# Patient Record
Sex: Female | Born: 2000
Health system: Southern US, Community
[De-identification: ages and names within clinical notes are randomized; demographics above are authoritative.]

## PROBLEM LIST (undated history)

## (undated) HISTORY — PX: NO PAST SURGERIES: SHX2092

---
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
## 2020-01-24 DIAGNOSIS — Z3A19 19 weeks gestation of pregnancy: Secondary | ICD-10-CM | POA: Diagnosis not present

## 2020-01-24 DIAGNOSIS — Z3689 Encounter for other specified antenatal screening: Secondary | ICD-10-CM | POA: Diagnosis not present

## 2020-01-24 DIAGNOSIS — Z363 Encounter for antenatal screening for malformations: Secondary | ICD-10-CM | POA: Diagnosis not present

## 2020-01-27 DIAGNOSIS — Z3A20 20 weeks gestation of pregnancy: Secondary | ICD-10-CM | POA: Diagnosis not present

## 2020-01-27 DIAGNOSIS — Z3402 Encounter for supervision of normal first pregnancy, second trimester: Secondary | ICD-10-CM | POA: Diagnosis not present

## 2020-02-13 DIAGNOSIS — R300 Dysuria: Secondary | ICD-10-CM | POA: Diagnosis not present

## 2020-02-23 ENCOUNTER — Other Ambulatory Visit: Payer: Self-pay

## 2020-02-23 ENCOUNTER — Inpatient Hospital Stay (HOSPITAL_COMMUNITY)
Admission: AD | Admit: 2020-02-23 | Discharge: 2020-02-23 | Disposition: A | Discharge status: Home / Self Care | Payer: BC Managed Care – PPO | Attending: Obstetrics and Gynecology | Admitting: Obstetrics and Gynecology

## 2020-02-23 ENCOUNTER — Encounter (HOSPITAL_COMMUNITY): Payer: Self-pay | Admitting: Obstetrics and Gynecology

## 2020-02-23 DIAGNOSIS — O26892 Other specified pregnancy related conditions, second trimester: Secondary | ICD-10-CM | POA: Diagnosis not present

## 2020-02-23 DIAGNOSIS — Z3A23 23 weeks gestation of pregnancy: Secondary | ICD-10-CM | POA: Diagnosis not present

## 2020-02-23 DIAGNOSIS — O98812 Other maternal infectious and parasitic diseases complicating pregnancy, second trimester: Secondary | ICD-10-CM | POA: Insufficient documentation

## 2020-02-23 DIAGNOSIS — B373 Candidiasis of vulva and vagina: Secondary | ICD-10-CM

## 2020-02-23 DIAGNOSIS — R1030 Lower abdominal pain, unspecified: Secondary | ICD-10-CM | POA: Diagnosis not present

## 2020-02-23 DIAGNOSIS — O368120 Decreased fetal movements, second trimester, not applicable or unspecified: Secondary | ICD-10-CM | POA: Diagnosis not present

## 2020-02-23 DIAGNOSIS — Z3689 Encounter for other specified antenatal screening: Secondary | ICD-10-CM

## 2020-02-23 DIAGNOSIS — O26852 Spotting complicating pregnancy, second trimester: Secondary | ICD-10-CM | POA: Diagnosis not present

## 2020-02-23 DIAGNOSIS — B3731 Acute candidiasis of vulva and vagina: Secondary | ICD-10-CM

## 2020-02-23 LAB — URINALYSIS, ROUTINE W REFLEX MICROSCOPIC
Bilirubin Urine: NEGATIVE
Glucose, UA: NEGATIVE mg/dL
Ketones, ur: NEGATIVE mg/dL
Nitrite: NEGATIVE
Protein, ur: NEGATIVE mg/dL
Specific Gravity, Urine: 1.015 (ref 1.005–1.030)
pH: 7.5 (ref 5.0–8.0)

## 2020-02-23 LAB — HIV ANTIBODY (ROUTINE TESTING W REFLEX): HIV Screen 4th Generation wRfx: NONREACTIVE

## 2020-02-23 LAB — OB RESULTS CONSOLE GC/CHLAMYDIA: Gonorrhea: NEGATIVE

## 2020-02-23 LAB — URINALYSIS, MICROSCOPIC (REFLEX)

## 2020-02-23 LAB — WET PREP, GENITAL
Clue Cells Wet Prep HPF POC: NONE SEEN
Sperm: NONE SEEN
Trich, Wet Prep: NONE SEEN

## 2020-02-23 MED ORDER — FLUCONAZOLE 150 MG PO TABS: 150.0000 mg | ORAL_TABLET | ORAL | 1 refills | Status: DC

## 2020-02-23 NOTE — MAU Note (Signed)
Rebecca Zimmerman is a 19 y.o. at [redacted]w[redacted]d here in MAU reporting: since Saturday has been having DFM. Went to the bathroom today and saw some green discharge and bleeding. Having intermittent lower abdominal/pelvic pain but denies pain at this time. No LOF.  Was getting [facility] in [place] but last appointment was at the end of October. Is waiting on [facility] to call her back regarding an appointment.  Onset of complaint: ongoing  Pain score:  0/10  Vitals:   02/23/20 1151  BP: 131/80  Pulse: 93  Resp: 16  Temp: 98.5 F (36.9 C)  SpO2: 98%     FHT: 160  Lab orders placed from triage: UA, UPT

## 2020-02-23 NOTE — Discharge Instructions (Signed)
Prenatal Care Providers           Center for [facility] @ [facility]  [address] [phone]  Center for [facility] @ [facility]   [address]  [phone]  Center For [facility] @ [facility]       [address] [phone]            Center for [facility] @ [place]     [phone]          Center for [facility] @ [facility]   [phone]  Center for [facility] @ [facility]  [address] [phone]     Center for [facility] @ [address] [place])  [address]   [phone]     [facility]  Phone: [phone]  [facility]  Phone: [phone]  [facility] Phone: [phone]  [facility] Phone: [phone]  [facility] Phone: [phone]  [facility] Phone: [phone]

## 2020-02-23 NOTE — MAU Provider Note (Signed)
History     CSN: [id number]  Arrival date and time: 02/23/20 1126   First Provider Initiated Contact with Patient 02/23/20 1237      Chief Complaint  Patient presents with  . Vaginal Bleeding  . Vaginal Discharge  . Decreased Fetal Movement   Rebecca Zimmerman is a 19 y.o. year old G1P0 female at [redacted]w[redacted]d weeks gestation who presents to MAU reporting she noticed spotting and green discharge with wiping x 1 episode, lower abdominal cramping and DFM since Saturday. She denies LOF She reports OB care in [place]; last appt the end of October. She reports that she is trying to get established with a local OB and not having any luck finding a provider/office that takes her insurance without large payment and co-payments.   OB History    Gravida  1   Para      Term      Preterm      AB      Living        SAB      TAB      Ectopic      Multiple      Live Births              History reviewed. No pertinent past medical history.  Past Surgical History:  Procedure Laterality Date  . NO PAST SURGERIES      Family History  Problem Relation Age of Onset  . Epilepsy Father   . Healthy Sister   . Healthy Brother   . Healthy Brother   . Healthy Brother     Social History   Tobacco Use  . Smoking status: Never Smoker  . Smokeless tobacco: Never Used  Substance Use Topics  . Alcohol use: No    Alcohol/week: 0.0 standard drinks  . Drug use: No    Allergies: No Known Allergies  No medications prior to admission.    Review of Systems  Constitutional: Negative.   HENT: Negative.   Eyes: Negative.   Respiratory: Negative.   Cardiovascular: Negative.   Gastrointestinal: Negative.   Endocrine: Negative.   Genitourinary: Positive for vaginal discharge (green discharge and spotting noted on tissue this AM).       DFM since Saturday 02/19/2020  Musculoskeletal: Negative.   Skin: Negative.   Allergic/Immunologic: Negative.   Neurological: Negative.    Hematological: Negative.   Psychiatric/Behavioral: Negative.    Physical Exam   Blood pressure 115/61, pulse 84, temperature 98.4 F (36.9 C), temperature source Oral, resp. rate 16, height 5\' 7"  (1.702 m), weight 90.5 kg, SpO2 100 %.  Physical Exam Vitals and nursing note reviewed. Exam conducted with a chaperone present.  Constitutional:      Appearance: Normal appearance. She is normal weight.  HENT:     Head: Normocephalic and atraumatic.  Eyes:     Pupils: Pupils are equal, round, and reactive to light.  Cardiovascular:     Rate and Rhythm: Normal rate.     Pulses: Normal pulses.  Pulmonary:     Effort: Pulmonary effort is normal.  Genitourinary:    General: Normal vulva.     Comments: Uterus: gravid, S=D, SE: cervix is smooth, pink, no lesions, copious amt of thick, clumpy, greenish yellow vaginal d/c -- WP, GC/CT done, closed/long/firm, no CMT or friability, no adnexal tenderness  Musculoskeletal:     Cervical back: Normal range of motion.  Neurological:     Mental Status: She is alert and oriented to person,  place, and time.  Psychiatric:        Mood and Affect: Mood normal.        Behavior: Behavior normal.        Thought Content: Thought content normal.        Judgment: Judgment normal.    REACTIVE NST - FHR: 140 bpm / moderate variability / accels present / decels absent / TOCO: none  MAU Course  Procedures  MDM CCUA Wet Prep GC/CT -- Results pending  HIV -- Results pending   Results for orders placed or performed during the hospital encounter of 02/23/20 (from the past 24 hour(s))  Urinalysis, Routine w reflex microscopic Urine, Clean Catch     Status: Abnormal   Collection Time: 02/23/20 12:42 PM  Result Value Ref Range   Color, Urine YELLOW YELLOW   APPearance CLOUDY (A) CLEAR   Specific Gravity, Urine 1.015 1.005 - 1.030   pH 7.5 5.0 - 8.0   Glucose, UA NEGATIVE NEGATIVE mg/dL   Hgb urine dipstick SMALL (A) NEGATIVE   Bilirubin Urine NEGATIVE  NEGATIVE   Ketones, ur NEGATIVE NEGATIVE mg/dL   Protein, ur NEGATIVE NEGATIVE mg/dL   Nitrite NEGATIVE NEGATIVE   Leukocytes,Ua LARGE (A) NEGATIVE  Urinalysis, Microscopic (reflex)     Status: Abnormal   Collection Time: 02/23/20 12:42 PM  Result Value Ref Range   RBC / HPF 0-5 0 - 5 RBC/hpf   WBC, UA 21-50 0 - 5 WBC/hpf   Bacteria, UA MANY (A) NONE SEEN   Squamous Epithelial / LPF 21-50 0 - 5  HIV Antibody (routine testing w rflx)     Status: None   Collection Time: 02/23/20 12:50 PM  Result Value Ref Range   HIV Screen 4th Generation wRfx Non Reactive Non Reactive  Wet prep, genital     Status: Abnormal   Collection Time: 02/23/20 12:52 PM  Result Value Ref Range   Yeast Wet Prep HPF POC PRESENT (A) NONE SEEN   Trich, Wet Prep NONE SEEN NONE SEEN   Clue Cells Wet Prep HPF POC NONE SEEN NONE SEEN   WBC, Wet Prep HPF POC MANY (A) NONE SEEN   Sperm NONE SEEN     Assessment and Plan  Candida vaginitis  - Rx for Diflucan 150 mg 1 tablet every 3 days x 3 doses   NST (non-stress test) reactive - Reassurance given that fetal well-being is normal at this time  - Discharge patient - Information provided on [facility] Care Providers list given  - Get scheduled with OB provider of choice - Patient verbalized an understanding of the plan of care and agrees.   Raelyn Mora, MSN, CNM 02/23/2020, 12:47 PM

## 2020-02-24 LAB — GC/CHLAMYDIA PROBE AMP (~~LOC~~) NOT AT ARMC
Chlamydia: NEGATIVE
Comment: NEGATIVE
Comment: NORMAL
Neisseria Gonorrhea: NEGATIVE

## 2020-03-06 DIAGNOSIS — O0993 Supervision of high risk pregnancy, unspecified, third trimester: Secondary | ICD-10-CM | POA: Insufficient documentation

## 2020-03-06 NOTE — Progress Notes (Signed)
Established patient visit   Patient: Rebecca Zimmerman   DOB: 2001-01-19   19 y.o. Female  MRN: [id number] Visit Date: 03/07/2020  Today's healthcare provider: Jairo Ben, FNP   Chief Complaint  Patient presents with  . Cyst   Subjective    HPI  Patient appears in office today to address concern of draining cyst located near her tail bone, according to telephone encounter from [facility] on 03/06/20 patient reported that cyst had been present since 03/01/20.Patient states today that "cyst/bump" began draining on 03/03/20 and states that drainage was a yellow bloody consistency, she states that sure is very tender to the touch she has been soaking in epson salt baths for relief. .   She is seeing OB for her pregnancy. She is [redacted] weeks pregnant.  She has had some second trimester bleeding thought to be from yeast infection that was treated.  She is feeling good fetal movement.  She denies any bleeding or contractions now.    Patient  denies any fever, body aches,chills, rash, chest pain, shortness of breath, nausea, vomiting, or diarrhea.  Denies dizziness, lightheadedness, pre syncopal or syncopal episodes.   There are no problems to display for this patient.  History reviewed. No pertinent past medical history. Past Surgical History:  Procedure Laterality Date  . NO PAST SURGERIES     Social History   Tobacco Use  . Smoking status: Never Smoker  . Smokeless tobacco: Never Used  Substance Use Topics  . Alcohol use: No    Alcohol/week: 0.0 standard drinks  . Drug use: No   Social History   Socioeconomic History  . Marital status: Single    Spouse name: Not on file  . Number of children: Not on file  . Years of education: Not on file  . Highest education level: Not on file  Occupational History  . Not on file  Tobacco Use  . Smoking status: Never Smoker  . Smokeless tobacco: Never Used  Substance and Sexual Activity  . Alcohol use: No     Alcohol/week: 0.0 standard drinks  . Drug use: No  . Sexual activity: Never  Other Topics Concern  . Not on file  Social History Narrative  . Not on file   Social Determinants of Health   Financial Resource Strain:   . Difficulty of Paying Living Expenses: Not on file  Food Insecurity:   . Worried About Programme researcher, broadcasting/film/video in the Last Year: Not on file  . Ran Out of Food in the Last Year: Not on file  Transportation Needs:   . Lack of Transportation (Medical): Not on file  . Lack of Transportation (Non-Medical): Not on file  Physical Activity:   . Days of Exercise per Week: Not on file  . Minutes of Exercise per Session: Not on file  Stress:   . Feeling of Stress : Not on file  Social Connections:   . Frequency of Communication with Friends and Family: Not on file  . Frequency of Social Gatherings with Friends and Family: Not on file  . Attends Religious Services: Not on file  . Active Member of Clubs or Organizations: Not on file  . Attends Banker Meetings: Not on file  . Marital Status: Not on file  Intimate Partner Violence:   . Fear of Current or Ex-Partner: Not on file  . Emotionally Abused: Not on file  . Physically Abused: Not on file  . Sexually Abused: Not  on file   Family Status  Relation Name Status  . Mother  Alive  . Father  Alive  . Sister  Alive  . Brother  Alive  . Brother  Alive  . Brother  Alive   Family History  Problem Relation Age of Onset  . Epilepsy Father   . Healthy Sister   . Healthy Brother   . Healthy Brother   . Healthy Brother    No Known Allergies     Medications: Outpatient Medications Prior to Visit  Medication Sig  . ondansetron (ZOFRAN) 4 MG tablet Take 4 mg by mouth every 8 (eight) hours as needed for nausea or vomiting.  . [DISCONTINUED] fluconazole (DIFLUCAN) 150 MG tablet Take 1 tablet (150 mg total) by mouth every 3 (three) days.   No facility-administered medications prior to visit.    Review of  Systems    Last CBC Lab Results  Component Value Date   WBC 9.1 05/18/2015   HGB 12.2 05/18/2015   HCT 35.3 05/18/2015   MCV 92 05/18/2015   MCH 31.6 05/18/2015   RDW 12.3 05/18/2015   PLT 264 05/18/2015   Last metabolic panel Lab Results  Component Value Date   GLUCOSE 106 (H) 05/18/2015   NA 138 05/18/2015   K 3.9 05/18/2015   CL 98 05/18/2015   CO2 26 05/18/2015   BUN 8 05/18/2015   CREATININE 0.73 05/18/2015   GFRNONAA CANCELED 05/18/2015   GFRAA CANCELED 05/18/2015   CALCIUM 10.1 05/18/2015   PROT 7.6 05/18/2015   ALBUMIN 5.1 05/18/2015   LABGLOB 2.5 05/18/2015   AGRATIO 2.0 05/18/2015   BILITOT 1.6 (H) 05/18/2015   ALKPHOS 84 05/18/2015   AST 12 05/18/2015   ALT 10 05/18/2015   Last lipids No results found for: CHOL, HDL, LDLCALC, LDLDIRECT, TRIG, CHOLHDL Last hemoglobin A1c No results found for: HGBA1C Last thyroid functions No results found for: TSH, T3TOTAL, T4TOTAL, THYROIDAB Last vitamin D No results found for: 25OHVITD2, 25OHVITD3, VD25OH Last vitamin B12 and Folate No results found for: VITAMINB12, FOLATE    Objective    BP 120/66   Pulse 89   Temp 98.2 F (36.8 C) (Oral)   Resp 16   Wt 207 lb 12.8 oz (94.3 kg)   SpO2 100%   BMI 32.55 kg/m  BP Readings from Last 3 Encounters:  03/07/20 120/66  02/23/20 115/61  02/22/19 114/74   Wt Readings from Last 3 Encounters:  03/07/20 207 lb 12.8 oz (94.3 kg) (98 %, Z= 2.06)*  02/23/20 199 lb 9.6 oz (90.5 kg) (97 %, Z= 1.94)*  02/22/19 160 lb 3.2 oz (72.7 kg) (89 %, Z= 1.25)*   * Growth percentiles are based on CDC (Girls, 2-20 Years) data.      Physical Exam Vitals reviewed.  Constitutional:      General: She is not in acute distress.    Appearance: Normal appearance. She is not ill-appearing, toxic-appearing or diaphoretic.     Comments: Patient appers well, not sickly. Speaking in complete sentences. Patient moves on and off of exam table and in room without difficulty. Gait is normal  in hall and in room. Patient is oriented to person place time and situation. Patient answers questions appropriately and engages eye contact and verbal dialect with provider.   HENT:     Head: Normocephalic and atraumatic.     Right Ear: External ear normal.     Left Ear: External ear normal.     Nose: Nose normal. No  congestion or rhinorrhea.     Mouth/Throat:     Pharynx: Oropharynx is clear. No oropharyngeal exudate or posterior oropharyngeal erythema.  Eyes:     General: No scleral icterus.    Extraocular Movements: Extraocular movements intact.     Conjunctiva/sclera: Conjunctivae normal.  Cardiovascular:     Rate and Rhythm: Normal rate and regular rhythm.     Pulses: Normal pulses.     Heart sounds: Normal heart sounds.  Pulmonary:     Effort: Pulmonary effort is normal. No respiratory distress.     Breath sounds: Normal breath sounds. No stridor. No wheezing, rhonchi or rales.  Chest:     Chest wall: No tenderness.  Abdominal:     Palpations: Abdomen is soft.     Comments: Pregnant   Musculoskeletal:        General: Normal range of motion.     Cervical back: Normal range of motion and neck supple. No rigidity.  Lymphadenopathy:     Lower Body: No right inguinal adenopathy. No left inguinal adenopathy.  Skin:    General: Skin is warm.     Findings: Abscess present. No rash.       Neurological:     Mental Status: She is oriented to person, place, and time.     Motor: No weakness.     Gait: Gait normal.  Psychiatric:        Mood and Affect: Mood normal.        Behavior: Behavior normal.        Thought Content: Thought content normal.        Judgment: Judgment normal.     No results found for any visits on 03/07/20.  Assessment & Plan     Pilonidal cyst with abscess - Plan: Ambulatory referral to General Surgery, CBC with Differential/Platelet, Comprehensive Metabolic Panel (CMET), Aerobic culture  Orders Placed This Encounter  Procedures  . Aerobic culture    . CBC with Differential/Platelet  . Comprehensive Metabolic Panel (CMET)  . Ambulatory referral to General Surgery    Culture obtained.  Meds ordered this encounter  Medications  . cephALEXin (KEFLEX) 500 MG capsule    Sig: Take 1 capsule (500 mg total) by mouth 3 (three) times daily.    Dispense:  21 capsule    Refill:  0  Start medication as above, discussed risk versus benefits.  Draining already no need for I & D at this time.  Wound care instructions given.  An After Visit Summary was printed and given to the patient. Seek care with OBGYN if pregnancy concerns.   Will refer to surgeon for evaluation.    Red Flags discussed. The patient was given clear instructions to go to ER or return to medical center if any red flags develop, symptoms do not improve, worsen or new problems develop. They verbalized understanding.  Return in about 2 weeks (around 03/21/2020), or if symptoms worsen or fail to improve, for at any time for any worsening symptoms, Go to Emergency room/ urgent care if worse.      Needs to have CPE done and establish care with provider has not been seen since seeing [place] PA in office.     Jairo Ben, FNP  [facility] [phone] (phone) [phone] (fax)  [facility]

## 2020-03-07 ENCOUNTER — Encounter: Payer: Self-pay | Admitting: Adult Health

## 2020-03-07 ENCOUNTER — Other Ambulatory Visit: Payer: Self-pay

## 2020-03-07 ENCOUNTER — Ambulatory Visit (INDEPENDENT_AMBULATORY_CARE_PROVIDER_SITE_OTHER): Payer: BC Managed Care – PPO | Admitting: Adult Health

## 2020-03-07 VITALS — BP 120/66 | HR 89 | Temp 98.2°F | Resp 16 | Wt 207.8 lb

## 2020-03-07 DIAGNOSIS — L0501 Pilonidal cyst with abscess: Secondary | ICD-10-CM | POA: Diagnosis not present

## 2020-03-07 MED ORDER — CEPHALEXIN 500 MG PO CAPS: 500.0000 mg | ORAL_CAPSULE | Freq: Three times a day (TID) | ORAL | 0 refills | Status: DC

## 2020-03-07 NOTE — Patient Instructions (Signed)
Cephalexin Tablets or Capsules What is this medicine? CEPHALEXIN (sef a LEX in) is a cephalosporin antibiotic. It treats some infections caused by bacteria. It will not work for colds, the flu, or other viruses. This medicine may be used for other purposes; ask your health care provider or pharmacist if you have questions. COMMON BRAND NAME(S): Biocef, Daxbia, Keflex, Keftab What should I tell my health care provider before I take this medicine? They need to know if you have any of these conditions:  kidney disease  stomach or intestine problems, especially colitis  an unusual or allergic reaction to cephalexin, other cephalosporins, penicillins, other antibiotics, medicines, foods, dyes or preservatives  pregnant or trying to get pregnant  breast-feeding How should I use this medicine? Take this drug by mouth. Take it as directed on the prescription label at the same time every day. You can take it with or without food. If it upsets your stomach, take it with food. Take all of this drug unless your health care provider tells you to stop it early. Keep taking it even if you think you are better. Talk to your health care provider about the use of this drug in children. While it may be prescribed for selected conditions, precautions do apply. Overdosage: If you think you have taken too much of this medicine contact a poison control center or emergency room at once. NOTE: This medicine is only for you. Do not share this medicine with others. What if I miss a dose? If you miss a dose, take it as soon as you can. If it is almost time for your next dose, take only that dose. Do not take double or extra doses. What may interact with this medicine?  probenecid  some other antibiotics This list may not describe all possible interactions. Give your health care provider a list of all the medicines, herbs, non-prescription drugs, or dietary supplements you use. Also tell them if you smoke, drink  alcohol, or use illegal drugs. Some items may interact with your medicine. What should I watch for while using this medicine? Tell your doctor or health care provider if your symptoms do not begin to improve in a few days. This medicine may cause serious skin reactions. They can happen weeks to months after starting the medicine. Contact your health care provider right away if you notice fevers or flu-like symptoms with a rash. The rash may be red or purple and then turn into blisters or peeling of the skin. Or, you might notice a red rash with swelling of the face, lips or lymph nodes in your neck or under your arms. Do not treat diarrhea with over the counter products. Contact your doctor if you have diarrhea that lasts more than 2 days or if it is severe and watery. If you have diabetes, you may get a false-positive result for sugar in your urine. Check with your doctor or health care provider. What side effects may I notice from receiving this medicine? Side effects that you should report to your doctor or health care professional as soon as possible:  allergic reactions like skin rash, itching or hives, swelling of the face, lips, or tongue  breathing problems  pain or trouble passing urine  redness, blistering, peeling or loosening of the skin, including inside the mouth  severe or watery diarrhea  unusually weak or tired  yellowing of the eyes, skin Side effects that usually do not require medical attention (report to your doctor or health care professional   if they continue or are bothersome):  gas or heartburn  genital or anal irritation  headache  joint or muscle pain  nausea, vomiting This list may not describe all possible side effects. Call your doctor for medical advice about side effects. You may report side effects to FDA at 1-800-FDA-1088. Where should I keep my medicine? Keep out of the reach of children and pets. Store at room temperature between 20 and 25  degrees C (68 and 77 degrees F). Throw away any unused drug after the expiration date. NOTE: This sheet is a summary. It may not cover all possible information. If you have questions about this medicine, talk to your doctor, pharmacist, or health care provider.  2020 Elsevier/Gold Standard (2018-10-30 11:27:00) Pilonidal Cyst  A pilonidal cyst is a fluid-filled sac that forms beneath the skin near the tailbone, at the top of the crease of the buttocks (pilonidal area). If the cyst is not large and not infected, it may not cause any problems. If the cyst becomes irritated or infected, it may get larger and fill with pus. An infected cyst is called an abscess. A pilonidal abscess may cause pain and swelling, and it may need to be drained or removed. What are the causes? The cause of this condition is not always known. In some cases, a hair that grows into your skin (ingrown hair) may be the cause. What increases the risk? You are more likely to get a pilonidal cyst if you:  Are female.  Have lots of hair near the crease of the buttocks.  Are overweight.  Have a dimple near the crease of the buttocks.  Wear tight clothing.  Do not bathe or shower often.  Sit for long periods of time. What are the signs or symptoms? Signs and symptoms of a pilonidal cyst may include pain, swelling, redness, and warmth in the pilonidal area. Depending on how big the cyst is, you may be able to feel a lump near your tailbone. If your cyst becomes infected, symptoms may include:  Pus or fluid drainage.  Fever.  Pain, swelling, and redness getting worse.  The lump getting bigger. How is this diagnosed? This condition may be diagnosed based on:  Your symptoms and medical history.  A physical exam.  A blood test to check for infection.  Testing a pus sample, if applicable. How is this treated? If your cyst does not cause symptoms, you may not need any treatment. If your cyst bothers you or is  infected, you may need a procedure to drain or remove the cyst. Depending on the size, location, and severity of your cyst, your health care provider may:  Make an incision in the cyst and drain it (incision and drainage).  Open and drain the cyst, and then stitch the wound so that it stays open while it heals (marsupialization). You will be given instructions about how to care for your open wound while it heals.  Remove all or part of the cyst, and then close the wound (cyst removal). You may need to take antibiotic medicines before your procedure. Follow these instructions at home: Medicines  Take over-the-counter and prescription medicines only as told by your health care provider.  If you were prescribed an antibiotic medicine, take it as told by your health care provider. Do not stop taking the antibiotic even if you start to feel better. General instructions  Keep the area around your pilonidal cyst clean and dry.  If there is fluid or pus  draining from your cyst: ? Cover the area with a clean bandage (dressing) as needed. ? Wash the area gently with soap and water. Pat the area dry with a clean towel. Do not rub the area because that may cause bleeding.  Remove hair from the area around the cyst only if your health care provider tells you to do this.  Do not wear tight pants or sit in one position for long periods at a time.  Keep all follow-up visits as told by your health care provider. This is important. Contact a health care provider if you have:  New redness, swelling, or pain.  A fever.  Severe pain. Summary  A pilonidal cyst is a fluid-filled sac that forms beneath the skin near the tailbone, at the top of the crease of the buttocks (pilonidal area).  If the cyst becomes irritated or infected, it may get larger and fill with pus. An infected cyst is called an abscess.  The cause of this condition is not always known. In some cases, a hair that grows into your  skin (ingrown hair) may be the cause.  If your cyst does not cause symptoms, you may not need any treatment. If your cyst bothers you or is infected, you may need a procedure to drain or remove the cyst. This information is not intended to replace advice given to you by your health care provider. Make sure you discuss any questions you have with your health care provider. Document Revised: 03/13/2017 Document Reviewed: 03/13/2017 Elsevier Patient Education  2020 [employer].

## 2020-03-12 LAB — AEROBIC CULTURE

## 2020-03-12 NOTE — Progress Notes (Signed)
Routine flora, finish antibiotics and do advise see surgeon if persistent. Referral has been placed at time of office visit already.

## 2020-03-15 DIAGNOSIS — Z8619 Personal history of other infectious and parasitic diseases: Secondary | ICD-10-CM | POA: Diagnosis not present

## 2020-03-16 ENCOUNTER — Ambulatory Visit: Payer: BC Managed Care – PPO | Admitting: Surgery

## 2020-03-21 ENCOUNTER — Ambulatory Visit: Payer: Self-pay | Admitting: Adult Health

## 2020-03-21 NOTE — Progress Notes (Deleted)
      Established patient visit   Patient: Rebecca Zimmerman   DOB: [birth year]   19 y.o. Female  MRN: [id number] Visit Date: 03/21/2020  Today's healthcare provider: Jairo Ben, FNP   No chief complaint on file.  Subjective    HPI  Follow up for pilonidal cyst with abscess   The patient was last seen for this 2 weeks ago. Changes made at last visit include patient started on Keflex 500mg , referral was placed to general surgery and labs were ordered. .  She reports {excellent/good/fair/poor:19665} compliance with treatment. She feels that condition is {improved/worse/unchanged:3041574}. She {is/is not:21021397} having side effects. ***  -----------------------------------------------------------------------------------------    {Show patient history (optional):23778::" "}   Medications: Outpatient Medications Prior to Visit  Medication Sig  . cephALEXin (KEFLEX) 500 MG capsule Take 1 capsule (500 mg total) by mouth 3 (three) times daily.  . ondansetron (ZOFRAN) 4 MG tablet Take 4 mg by mouth every 8 (eight) hours as needed for nausea or vomiting.   No facility-administered medications prior to visit.    Review of Systems  {Heme  Chem  Endocrine  Serology  Results Review (optional):23779::" "}  Objective    There were no vitals taken for this visit. {Show previous vital signs (optional):23777::" "}  Physical Exam  ***  No results found for any visits on 03/21/20.  Assessment & Plan     ***  No follow-ups on file.      {provider attestation***:1}   03/23/20, FNP  [facility] [phone] (phone) [phone] (fax)  [facility]

## 2020-03-30 DIAGNOSIS — Z3482 Encounter for supervision of other normal pregnancy, second trimester: Secondary | ICD-10-CM | POA: Diagnosis not present

## 2020-03-30 LAB — OB RESULTS CONSOLE RUBELLA ANTIBODY, IGM: Rubella: IMMUNE

## 2020-03-30 LAB — OB RESULTS CONSOLE VARICELLA ZOSTER ANTIBODY, IGG: Varicella: IMMUNE

## 2020-04-08 NOTE — L&D Delivery Note (Signed)
Delivery Note  CARMEN TOLLIVER is a G1P1001 at [redacted]w[redacted]d with an LMP of 09/08/2020, consistent with [place] at [redacted]w[redacted]d.   First Stage: Labor onset: 1000 Induction: misoprostol and oxytocin Analgesia /Anesthesia intrapartum: Epidural SROM at 1428  Second Stage: Complete dilation at 1628 Onset of pushing at 1632 FHR second stage: 115bpm with moderate variable, variable decel with pushing, prolong decel prior to delivery of baby.  Raelynn presented to L&D for scheduled IOL d/t cholestasis in pregnancy.  She progressed well with low dose oxytocin and had rapid cervical change from 4 cm to 10 cm after SROM.  She had a spontaneous urge to push and pushed well over 2 contractions.  Delivery of a viable baby boy on 05/26/2020 at 1635 by CNM Delivery of fetal head in OA position with restitution to LOT. No nuchal cord;  Anterior then posterior shoulders delivered easily with gentle downward traction. Baby placed on mom's chest, and attended to by baby RN. Cord double clamped after cessation of pulsation, cut by father of baby  Cord blood sample collected: O pos   Third Stage: Oxytocin bolus started after delivery of infant for hemorrhage prophylaxis  Placenta delivered intact with 3 VC @ 1641 Placenta disposition: discarded Uterine tone firm / bleeding moderate  1st perineal laceration identified  Anesthesia for repair: epdiural Repair in usual fashion with 2-0 Vicryl CT Est. Blood Loss (mL):  Complications: None  Mom to postpartum.  Baby to NICU d/t grunting.  Newborn: Information for the patient's newborn:  [place], [place] [[id number]]  Live born female  Birth Weight: 5 lb 14.5 oz (2680 g) APGAR: 8, 8  Newborn Delivery   Birth date/time: 05/26/2020 16:35:00 Delivery type: Vaginal, Spontaneous     Feeding planned: formula   ---------- Margaretmary Eddy, CNM Certified Nurse Midwife [facility]

## 2020-04-19 DIAGNOSIS — Z3A31 31 weeks gestation of pregnancy: Secondary | ICD-10-CM | POA: Diagnosis not present

## 2020-04-19 DIAGNOSIS — O4693 Antepartum hemorrhage, unspecified, third trimester: Secondary | ICD-10-CM | POA: Diagnosis not present

## 2020-04-26 DIAGNOSIS — L299 Pruritus, unspecified: Secondary | ICD-10-CM | POA: Diagnosis not present

## 2020-04-26 DIAGNOSIS — O99713 Diseases of the skin and subcutaneous tissue complicating pregnancy, third trimester: Secondary | ICD-10-CM | POA: Diagnosis not present

## 2020-05-04 ENCOUNTER — Other Ambulatory Visit: Payer: Self-pay

## 2020-05-04 ENCOUNTER — Encounter: Payer: Self-pay | Admitting: Obstetrics and Gynecology

## 2020-05-04 ENCOUNTER — Observation Stay
Admission: EM | Admit: 2020-05-04 | Discharge: 2020-05-04 | Disposition: A | Discharge status: Home / Self Care | Payer: BC Managed Care – PPO | Attending: Certified Nurse Midwife | Admitting: Certified Nurse Midwife

## 2020-05-04 DIAGNOSIS — Z3A34 34 weeks gestation of pregnancy: Secondary | ICD-10-CM | POA: Insufficient documentation

## 2020-05-04 DIAGNOSIS — O368130 Decreased fetal movements, third trimester, not applicable or unspecified: Secondary | ICD-10-CM | POA: Diagnosis not present

## 2020-05-04 DIAGNOSIS — O368190 Decreased fetal movements, unspecified trimester, not applicable or unspecified: Secondary | ICD-10-CM | POA: Diagnosis present

## 2020-05-04 NOTE — Discharge Summary (Signed)
Patient ID: Rebecca Zimmerman MRN: [id number] DOB/AGE: [birth year] 20 y.o.  Admit date: 05/04/2020 Discharge date: 05/04/2020  Admission Diagnoses: Decreased Fetal Movement  Discharge Diagnoses: GFM with reactive NST  Prenatal Procedures: none  Consults: None  Significant Diagnostic Studies:  No results found for this or any previous visit (from the past 168 hour(s)).  Treatments: NST  Hospital Course:  This is a 20 y.o. G1P0 with IUP at [redacted]w[redacted]d seen for DFM.  No UCs, leaking of fluid and no bleeding.  She was observed, fetal heart rate monitoring remained reassuring, and she had no signs/symptoms of labor or other maternal-fetal concerns.  She was deemed stable for discharge to home with outpatient follow up.  Discharge Physical Exam:  BP 134/86 (BP Location: Right Arm)   Pulse 85   Temp 98.1 F (36.7 C) (Oral)   Resp 16   General: NAD CV: RRR Pulm: CTABL, nl effort ABD: s/nd/nt, gravid DVT Evaluation: LE non-ttp, no evidence of DVT on exam.  NST: FHR baseline: 140 bpm Variability: moderate Accelerations: yes Decelerations: none Time: 50 minutes Category/reactivity: reactive  TOCO: quiet, Pt denies feeling any contractions SVE: deferred      Discharge Condition: Stable  Disposition:  Discharge disposition: 01-Home or Self Care        Allergies as of 05/04/2020   No Known Allergies     Medication List    TAKE these medications   cephALEXin 500 MG capsule Commonly known as: KEFLEX Take 1 capsule (500 mg total) by mouth 3 (three) times daily.   ondansetron 4 MG tablet Commonly known as: ZOFRAN Take 4 mg by mouth every 8 (eight) hours as needed for nausea or vomiting.        SignedHaroldine Laws, CNM 05/04/2020 1:54 PM

## 2020-05-04 NOTE — OB Triage Note (Signed)
Pt reports a decrease in fetal movement since last night. She has been diagnosis recently with cholestasis and is really nervous and worried. No vaginal bleeding or leaking fluid.

## 2020-05-05 DIAGNOSIS — O26613 Liver and biliary tract disorders in pregnancy, third trimester: Secondary | ICD-10-CM | POA: Diagnosis present

## 2020-05-05 DIAGNOSIS — K831 Obstruction of bile duct: Secondary | ICD-10-CM | POA: Diagnosis present

## 2020-05-05 DIAGNOSIS — O0993 Supervision of high risk pregnancy, unspecified, third trimester: Secondary | ICD-10-CM | POA: Diagnosis not present

## 2020-05-06 ENCOUNTER — Other Ambulatory Visit: Payer: Self-pay | Admitting: Obstetrics and Gynecology

## 2020-05-06 DIAGNOSIS — K831 Obstruction of bile duct: Secondary | ICD-10-CM

## 2020-05-06 DIAGNOSIS — O26613 Liver and biliary tract disorders in pregnancy, third trimester: Secondary | ICD-10-CM

## 2020-05-06 NOTE — Progress Notes (Signed)
Dating: EDD: 06/15/20  by LMP: 09/09/19 and c/w [place] at 10+1wks.   Preg c/b: 1. Cholestasis of pregnancy, dx 33wks  Bile acids 27.5 on 1/19  Start weekly NST and AFI  Plan delivery 36-37wks  2. Hx pilonidal cyst abscess 03/2020- given Keflex and referred to Gen Surg by PCP.  VB 3rd trimester 04/19/20:  Wet prep and STI screen, no e/o VB on exam.   3. H/o STI in this Pregnancy  Dx: Date - 12/02/19 Result - Chlamydia   TOC: Date - 03/15/20 Result - Neg/Neg    Prenatal Labs: Blood type/Rh  O Pos  Antibody screen neg  Rubella Immune  Varicella Immune  RPR NR  HBsAg Neg  HIV NR  GC neg  Chlamydia neg  Genetic screening negative  1 hour GTT 106  3 hour GTT   GBS  pending    Contraception: undecided, possible pills Infant feeding: breast Tdap: declines Flu: declined

## 2020-05-12 DIAGNOSIS — O26613 Liver and biliary tract disorders in pregnancy, third trimester: Secondary | ICD-10-CM | POA: Diagnosis not present

## 2020-05-12 DIAGNOSIS — O0993 Supervision of high risk pregnancy, unspecified, third trimester: Secondary | ICD-10-CM | POA: Diagnosis not present

## 2020-05-12 DIAGNOSIS — O26619 Liver and biliary tract disorders in pregnancy, unspecified trimester: Secondary | ICD-10-CM | POA: Diagnosis not present

## 2020-05-12 DIAGNOSIS — K831 Obstruction of bile duct: Secondary | ICD-10-CM | POA: Diagnosis not present

## 2020-05-19 DIAGNOSIS — K831 Obstruction of bile duct: Secondary | ICD-10-CM | POA: Diagnosis not present

## 2020-05-19 DIAGNOSIS — O0993 Supervision of high risk pregnancy, unspecified, third trimester: Secondary | ICD-10-CM | POA: Diagnosis not present

## 2020-05-19 DIAGNOSIS — O26619 Liver and biliary tract disorders in pregnancy, unspecified trimester: Secondary | ICD-10-CM | POA: Diagnosis not present

## 2020-05-19 DIAGNOSIS — Z113 Encounter for screening for infections with a predominantly sexual mode of transmission: Secondary | ICD-10-CM | POA: Diagnosis not present

## 2020-05-19 LAB — OB RESULTS CONSOLE GBS: GBS: NEGATIVE

## 2020-05-19 LAB — OB RESULTS CONSOLE RPR: RPR: NONREACTIVE

## 2020-05-23 ENCOUNTER — Other Ambulatory Visit: Payer: Self-pay

## 2020-05-23 ENCOUNTER — Other Ambulatory Visit
Admission: RE | Admit: 2020-05-23 | Discharge: 2020-05-23 | Disposition: A | Discharge status: Home / Self Care | Payer: BC Managed Care – PPO | Source: Ambulatory Visit | Attending: Obstetrics and Gynecology | Admitting: Obstetrics and Gynecology

## 2020-05-23 DIAGNOSIS — Z01812 Encounter for preprocedural laboratory examination: Secondary | ICD-10-CM | POA: Insufficient documentation

## 2020-05-23 DIAGNOSIS — Z20822 Contact with and (suspected) exposure to covid-19: Secondary | ICD-10-CM | POA: Insufficient documentation

## 2020-05-23 LAB — SARS CORONAVIRUS 2 (TAT 6-24 HRS): SARS Coronavirus 2: NEGATIVE

## 2020-05-25 ENCOUNTER — Encounter: Payer: Self-pay | Admitting: Obstetrics and Gynecology

## 2020-05-26 ENCOUNTER — Inpatient Hospital Stay: Payer: BC Managed Care – PPO | Admitting: Anesthesiology

## 2020-05-26 ENCOUNTER — Other Ambulatory Visit: Payer: Self-pay

## 2020-05-26 ENCOUNTER — Inpatient Hospital Stay
Admission: EM | Admit: 2020-05-26 | Discharge: 2020-05-28 | DRG: 805 | Disposition: A | Discharge status: Home / Self Care | Payer: BC Managed Care – PPO

## 2020-05-26 ENCOUNTER — Encounter: Payer: Self-pay | Admitting: Obstetrics & Gynecology

## 2020-05-26 DIAGNOSIS — Z3A37 37 weeks gestation of pregnancy: Secondary | ICD-10-CM | POA: Diagnosis not present

## 2020-05-26 DIAGNOSIS — O26613 Liver and biliary tract disorders in pregnancy, third trimester: Secondary | ICD-10-CM | POA: Diagnosis present

## 2020-05-26 DIAGNOSIS — O9081 Anemia of the puerperium: Secondary | ICD-10-CM | POA: Diagnosis not present

## 2020-05-26 DIAGNOSIS — O0993 Supervision of high risk pregnancy, unspecified, third trimester: Secondary | ICD-10-CM | POA: Diagnosis not present

## 2020-05-26 DIAGNOSIS — D62 Acute posthemorrhagic anemia: Secondary | ICD-10-CM | POA: Diagnosis not present

## 2020-05-26 DIAGNOSIS — O26643 Intrahepatic cholestasis of pregnancy, third trimester: Secondary | ICD-10-CM | POA: Diagnosis present

## 2020-05-26 DIAGNOSIS — O2662 Liver and biliary tract disorders in childbirth: Secondary | ICD-10-CM | POA: Diagnosis not present

## 2020-05-26 DIAGNOSIS — Z20822 Contact with and (suspected) exposure to covid-19: Secondary | ICD-10-CM | POA: Diagnosis present

## 2020-05-26 DIAGNOSIS — O9279 Other disorders of lactation: Secondary | ICD-10-CM | POA: Diagnosis not present

## 2020-05-26 DIAGNOSIS — K831 Obstruction of bile duct: Secondary | ICD-10-CM | POA: Diagnosis present

## 2020-05-26 DIAGNOSIS — O368130 Decreased fetal movements, third trimester, not applicable or unspecified: Secondary | ICD-10-CM

## 2020-05-26 LAB — CBC
HCT: 32.2 % — ABNORMAL LOW (ref 36.0–46.0)
Hemoglobin: 10.8 g/dL — ABNORMAL LOW (ref 12.0–15.0)
MCH: 30.9 pg (ref 26.0–34.0)
MCHC: 33.5 g/dL (ref 30.0–36.0)
MCV: 92 fL (ref 80.0–100.0)
Platelets: 270 10*3/uL (ref 150–400)
RBC: 3.5 MIL/uL — ABNORMAL LOW (ref 3.87–5.11)
RDW: 13 % (ref 11.5–15.5)
WBC: 12.1 10*3/uL — ABNORMAL HIGH (ref 4.0–10.5)
nRBC: 0 % (ref 0.0–0.2)

## 2020-05-26 LAB — COMPREHENSIVE METABOLIC PANEL
ALT: 143 U/L — ABNORMAL HIGH (ref 0–44)
AST: 110 U/L — ABNORMAL HIGH (ref 15–41)
Albumin: 2.9 g/dL — ABNORMAL LOW (ref 3.5–5.0)
Alkaline Phosphatase: 245 U/L — ABNORMAL HIGH (ref 38–126)
Anion gap: 12 (ref 5–15)
BUN: 9 mg/dL (ref 6–20)
CO2: 20 mmol/L — ABNORMAL LOW (ref 22–32)
Calcium: 9.6 mg/dL (ref 8.9–10.3)
Chloride: 103 mmol/L (ref 98–111)
Creatinine, Ser: 0.6 mg/dL (ref 0.44–1.00)
GFR, Estimated: >60 mL/min (ref >60)
Glucose, Bld: 102 mg/dL — ABNORMAL HIGH (ref 70–99)
Potassium: 3.7 mmol/L (ref 3.5–5.1)
Sodium: 135 mmol/L (ref 135–145)
Total Bilirubin: 1.4 mg/dL — ABNORMAL HIGH (ref 0.3–1.2)
Total Protein: 7.6 g/dL (ref 6.5–8.1)

## 2020-05-26 LAB — RPR: RPR Ser Ql: NONREACTIVE

## 2020-05-26 LAB — TYPE AND SCREEN
ABO/RH(D): O POS
Antibody Screen: NEGATIVE

## 2020-05-26 LAB — ABO/RH: ABO/RH(D): O POS

## 2020-05-26 LAB — PROTEIN / CREATININE RATIO, URINE
Creatinine, Urine: 52 mg/dL
Protein Creatinine Ratio: 0.17 mg/mg{Cre} — ABNORMAL HIGH (ref 0.00–0.15)
Total Protein, Urine: 9 mg/dL

## 2020-05-26 MED ORDER — WITCH HAZEL-GLYCERIN EX PADS
1.0000 "application " | MEDICATED_PAD | CUTANEOUS | Status: DC
  Administered 2020-05-26: 1 via TOPICAL

## 2020-05-26 MED ORDER — PRENATAL MULTIVITAMIN CH
1.0000 | ORAL_TABLET | Freq: Every day | ORAL | Status: DC
  Administered 2020-05-27 – 2020-05-28 (×2): 1 via ORAL
  Filled 2020-05-26 (×2): qty 1

## 2020-05-26 MED ORDER — PHENYLEPHRINE 40 MCG/ML (10ML) SYRINGE FOR IV PUSH (FOR BLOOD PRESSURE SUPPORT): 80.0000 ug | PREFILLED_SYRINGE | INTRAVENOUS | Status: DC | PRN

## 2020-05-26 MED ORDER — DIBUCAINE (PERIANAL) 1 % EX OINT: 1.0000 "application " | TOPICAL_OINTMENT | CUTANEOUS | Status: DC | PRN

## 2020-05-26 MED ORDER — IBUPROFEN 600 MG PO TABS
600.0000 mg | ORAL_TABLET | Freq: Four times a day (QID) | ORAL | Status: DC
  Administered 2020-05-26 – 2020-05-28 (×8): 600 mg via ORAL
  Filled 2020-05-26 (×8): qty 1

## 2020-05-26 MED ORDER — FENTANYL 2.5 MCG/ML W/ROPIVACAINE 0.15% IN NS 100 ML EPIDURAL (ARMC)
EPIDURAL | Status: AC
  Filled 2020-05-26: qty 100

## 2020-05-26 MED ORDER — SODIUM CHLORIDE 0.9 % IV SOLN: 5.0000 10*6.[IU] | Freq: Once | INTRAVENOUS | Status: DC

## 2020-05-26 MED ORDER — LACTATED RINGERS IV SOLN: 500.0000 mL | Freq: Once | INTRAVENOUS | Status: AC

## 2020-05-26 MED ORDER — OXYTOCIN BOLUS FROM INFUSION
333.0000 mL | Freq: Once | INTRAVENOUS | Status: AC
  Administered 2020-05-26: 333 mL via INTRAVENOUS

## 2020-05-26 MED ORDER — PENICILLIN G POT IN DEXTROSE 60000 UNIT/ML IV SOLN: 3.0000 10*6.[IU] | INTRAVENOUS | Status: DC

## 2020-05-26 MED ORDER — LIDOCAINE-EPINEPHRINE (PF) 1.5 %-1:200000 IJ SOLN
INTRAMUSCULAR | Status: DC | PRN
  Administered 2020-05-26: 3 mL via EPIDURAL

## 2020-05-26 MED ORDER — ONDANSETRON HCL 4 MG PO TABS: 4.0000 mg | ORAL_TABLET | ORAL | Status: DC | PRN

## 2020-05-26 MED ORDER — FENTANYL 2.5 MCG/ML W/ROPIVACAINE 0.15% IN NS 100 ML EPIDURAL (ARMC)
12.0000 mL/h | EPIDURAL | Status: DC
  Administered 2020-05-26: 12 mL/h via EPIDURAL

## 2020-05-26 MED ORDER — ACETAMINOPHEN 325 MG PO TABS: 650.0000 mg | ORAL_TABLET | ORAL | Status: DC | PRN

## 2020-05-26 MED ORDER — DIPHENHYDRAMINE HCL 50 MG/ML IJ SOLN: 12.5000 mg | INTRAMUSCULAR | Status: DC | PRN

## 2020-05-26 MED ORDER — MISOPROSTOL 200 MCG PO TABS
ORAL_TABLET | ORAL | Status: AC
  Administered 2020-05-26: 25 ug via VAGINAL
  Filled 2020-05-26: qty 4

## 2020-05-26 MED ORDER — LACTATED RINGERS IV SOLN
500.0000 mL | INTRAVENOUS | Status: DC | PRN
  Administered 2020-05-26 (×2): 500 mL via INTRAVENOUS

## 2020-05-26 MED ORDER — MISOPROSTOL 25 MCG QUARTER TABLET
25.0000 ug | ORAL_TABLET | ORAL | Status: DC | PRN
  Administered 2020-05-26: 25 ug via VAGINAL
  Filled 2020-05-26 (×2): qty 1

## 2020-05-26 MED ORDER — DIPHENHYDRAMINE HCL 25 MG PO CAPS: 25.0000 mg | ORAL_CAPSULE | Freq: Four times a day (QID) | ORAL | Status: DC | PRN

## 2020-05-26 MED ORDER — TERBUTALINE SULFATE 1 MG/ML IJ SOLN: 0.2500 mg | Freq: Once | INTRAMUSCULAR | Status: DC | PRN

## 2020-05-26 MED ORDER — OXYTOCIN-SODIUM CHLORIDE 30-0.9 UT/500ML-% IV SOLN
1.0000 m[IU]/min | INTRAVENOUS | Status: DC
  Administered 2020-05-26 (×2): 2 m[IU]/min via INTRAVENOUS

## 2020-05-26 MED ORDER — LIDOCAINE HCL (PF) 1 % IJ SOLN
INTRAMUSCULAR | Status: DC | PRN
  Administered 2020-05-26: 3 mL via SUBCUTANEOUS

## 2020-05-26 MED ORDER — OXYTOCIN-SODIUM CHLORIDE 30-0.9 UT/500ML-% IV SOLN
2.5000 [IU]/h | INTRAVENOUS | Status: DC
  Administered 2020-05-26 (×2): 2.5 [IU]/h via INTRAVENOUS
  Filled 2020-05-26: qty 500

## 2020-05-26 MED ORDER — DOCUSATE SODIUM 100 MG PO CAPS
100.0000 mg | ORAL_CAPSULE | Freq: Two times a day (BID) | ORAL | Status: DC
  Administered 2020-05-27 – 2020-05-28 (×3): 100 mg via ORAL
  Filled 2020-05-26 (×3): qty 1

## 2020-05-26 MED ORDER — SIMETHICONE 80 MG PO CHEW: 80.0000 mg | CHEWABLE_TABLET | ORAL | Status: DC | PRN

## 2020-05-26 MED ORDER — LACTATED RINGERS IV SOLN: INTRAVENOUS | Status: DC

## 2020-05-26 MED ORDER — COCONUT OIL OIL: 1.0000 "application " | TOPICAL_OIL | Status: DC | PRN

## 2020-05-26 MED ORDER — BENZOCAINE-MENTHOL 20-0.5 % EX AERO
1.0000 "application " | INHALATION_SPRAY | CUTANEOUS | Status: DC | PRN
  Filled 2020-05-26: qty 56

## 2020-05-26 MED ORDER — SOD CITRATE-CITRIC ACID 500-334 MG/5ML PO SOLN
30.0000 mL | ORAL | Status: DC | PRN
Start: 2020-05-26 — End: 2020-05-26

## 2020-05-26 MED ORDER — EPHEDRINE 5 MG/ML INJ: 10.0000 mg | INTRAVENOUS | Status: DC | PRN

## 2020-05-26 MED ORDER — MISOPROSTOL 25 MCG QUARTER TABLET
25.0000 ug | ORAL_TABLET | ORAL | Status: DC | PRN
Start: 2020-05-26 — End: 2020-05-26
  Administered 2020-05-26: 25 ug via BUCCAL
  Filled 2020-05-26: qty 1

## 2020-05-26 MED ORDER — ONDANSETRON HCL 4 MG/2ML IJ SOLN
4.0000 mg | Freq: Four times a day (QID) | INTRAMUSCULAR | Status: DC | PRN
  Administered 2020-05-26: 4 mg via INTRAVENOUS
  Filled 2020-05-26: qty 2

## 2020-05-26 MED ORDER — BUPIVACAINE HCL (PF) 0.25 % IJ SOLN
INTRAMUSCULAR | Status: DC | PRN
  Administered 2020-05-26: 3 mL via EPIDURAL
  Administered 2020-05-26: 4 mL via EPIDURAL

## 2020-05-26 MED ORDER — BUTORPHANOL TARTRATE 1 MG/ML IJ SOLN: 1.0000 mg | INTRAMUSCULAR | Status: DC | PRN

## 2020-05-26 MED ORDER — OXYTOCIN 10 UNIT/ML IJ SOLN
INTRAMUSCULAR | Status: AC
  Filled 2020-05-26: qty 2

## 2020-05-26 MED ORDER — LIDOCAINE HCL (PF) 1 % IJ SOLN
30.0000 mL | INTRAMUSCULAR | Status: DC | PRN
  Filled 2020-05-26: qty 30

## 2020-05-26 MED ORDER — ONDANSETRON HCL 4 MG/2ML IJ SOLN: 4.0000 mg | INTRAMUSCULAR | Status: DC | PRN

## 2020-05-26 MED ORDER — ACETAMINOPHEN 500 MG PO TABS
1000.0000 mg | ORAL_TABLET | Freq: Four times a day (QID) | ORAL | Status: DC | PRN
  Administered 2020-05-27 – 2020-05-28 (×2): 1000 mg via ORAL
  Filled 2020-05-26 (×2): qty 2

## 2020-05-26 MED ORDER — AMMONIA AROMATIC IN INHA
RESPIRATORY_TRACT | Status: AC
  Filled 2020-05-26: qty 10

## 2020-05-26 NOTE — Progress Notes (Signed)
Labor Progress Note  Rebecca Zimmerman is a 20 y.o. G1P0 at [redacted]w[redacted]d by LMP admitted for induction of labor due to Cholestasis of pregnancy.  Called to room for FHR decelerations   Subjective: feeling more cramping   Objective: BP 129/86   Pulse 82   Temp 98.3 F (36.8 C) (Oral)   Resp 15   Ht 5\' 7"  (1.702 m)   Wt 97.5 kg   LMP 09/09/2019   Breastfeeding Unknown   BMI 33.67 kg/m  Notable VS details: reviewed   Fetal Assessment: FHT:  FHR: 135 bpm, variability: moderate,  accelerations:  Present,  decelerations:  Present recurrent variables and prolong deceleration Category/reactivity:  Category II -> resolved to Category I after interventions  UC:   regular, every 1-4 minutes SVE:   3-4/70/-1/med/mid Membrane status: Intact Amniotic color: N/A  Labs: Lab Results  Component Value Date   WBC 12.1 (H) 05/26/2020   HGB 10.8 (L) 05/26/2020   HCT 32.2 (L) 05/26/2020   MCV 92.0 05/26/2020   PLT 270 05/26/2020    Assessment / Plan: Induction of labor due to Cholestasis,  progressing well on pitocin  -Pitocin turned off d/t FHR decelerations.   -Will restart after > 30 min of reactive tracing   Labor: Progressing normally Preeclampsia:  no s/s Fetal Wellbeing:  Category II -> resolved to category 1 tracing after conservative interventions  Pain Control:  Labor support without medications I/D:  n/a Anticipated MOD:  NSVD  05/28/2020, CNM 05/26/2020, 12:57 PM

## 2020-05-26 NOTE — Discharge Instructions (Signed)
Vaginal Delivery, Care After Refer to this sheet in the next few weeks. These discharge instructions provide you with information on caring for yourself after delivery. Your caregiver may also give you specific instructions. Your treatment has been planned according to the most current medical practices available, but problems sometimes occur. Call your caregiver if you have any problems or questions after you go home. HOME CARE INSTRUCTIONS 1. Take over-the-counter or prescription medicines only as directed by your caregiver or pharmacist. 2. Do not drink alcohol, especially if you are breastfeeding or taking medicine to relieve pain. 3. Do not smoke tobacco. 4. Continue to use good perineal care. Good perineal care includes: 1. Wiping your perineum from back to front 2. Keeping your perineum clean. 3. You can do sitz baths twice a day, to help keep this area clean 5. Do not use tampons, douche or have sex until your caregiver says it is okay. 6. Shower only and avoid sitting in submerged water, aside from sitz baths 7. Wear a well-fitting bra that provides breast support. 8. Eat healthy foods. 9. Drink enough fluids to keep your urine clear or pale yellow. 10. Eat high-fiber foods such as whole grain cereals and breads, brown rice, beans, and fresh fruits and vegetables every day. These foods may help prevent or relieve constipation. 11. Avoid constipation with high fiber foods or medications, such as miralax or metamucil 12. Follow your caregiver's recommendations regarding resumption of activities such as climbing stairs, driving, lifting, exercising, or traveling. 13. Talk to your caregiver about resuming sexual activities. Resumption of sexual activities is dependent upon your risk of infection, your rate of healing, and your comfort and desire to resume sexual activity. 14. Try to have someone help you with your household activities and your newborn for at least a few days after you leave  the hospital. 15. Rest as much as possible. Try to rest or take a nap when your newborn is sleeping. 16. Increase your activities gradually. 17. Keep all of your scheduled postpartum appointments. It is very important to keep your scheduled follow-up appointments. At these appointments, your caregiver will be checking to make sure that you are healing physically and emotionally. SEEK MEDICAL CARE IF:   You are passing large clots from your vagina. Save any clots to show your caregiver.  You have a foul smelling discharge from your vagina.  You have trouble urinating.  You are urinating frequently.  You have pain when you urinate.  You have a change in your bowel movements.  You have increasing redness, pain, or swelling near your vaginal incision (episiotomy) or vaginal tear.  You have pus draining from your episiotomy or vaginal tear.  Your episiotomy or vaginal tear is separating.  You have painful, hard, or reddened breasts.  You have a severe headache.  You have blurred vision or see spots.  You feel sad or depressed.  You have thoughts of hurting yourself or your newborn.  You have questions about your care, the care of your newborn, or medicines.  You are dizzy or light-headed.  You have a rash.  You have nausea or vomiting.  You were breastfeeding and have not had a menstrual period within 12 weeks after you stopped breastfeeding.  You are not breastfeeding and have not had a menstrual period by the 12th week after delivery.  You have a fever. SEEK IMMEDIATE MEDICAL CARE IF:   You have persistent pain.  You have chest pain.  You have shortness of breath.    You faint.  You have leg pain.  You have stomach pain.  Your vaginal bleeding saturates two or more sanitary pads in 1 hour. MAKE SURE YOU:   Understand these instructions.  Will watch your condition.  Will get help right away if you are not doing well or get worse. Document Released:  03/22/2000 Document Revised: 08/09/2013 Document Reviewed: 11/20/2011 ExitCare Patient Information 2015 [employer]. This information is not intended to replace advice given to you by your health care provider. Make sure you discuss any questions you have with your health care provider.  Sitz Bath A sitz bath is a warm water bath taken in the sitting position. The water covers only the hips and butt (buttocks). We recommend using one that fits in the toilet, to help with ease of use and cleanliness. It may be used for either healing or cleaning purposes. Sitz baths are also used to relieve pain, itching, or muscle tightening (spasms). The water may contain medicine. Moist heat will help you heal and relax.  HOME CARE  Take 3 to 4 sitz baths a day. 18. Fill the bathtub half-full with warm water. 19. Sit in the water and open the drain a little. 20. Turn on the warm water to keep the tub half-full. Keep the water running constantly. 21. Soak in the water for 15 to 20 minutes. 22. After the sitz bath, pat the affected area dry. GET HELP RIGHT AWAY IF: You get worse instead of better. Stop the sitz baths if you get worse. MAKE SURE YOU:  Understand these instructions.  Will watch your condition.  Will get help right away if you are not doing well or get worse. Document Released: 05/02/2004 Document Revised: 12/18/2011 Document Reviewed: 07/23/2010 ExitCare Patient Information 2015 [employer]. This information is not intended to replace advice given to you by your health care provider. Make sure you discuss any questions you have with your health care provider.    

## 2020-05-26 NOTE — Anesthesia Procedure Notes (Signed)
Epidural Patient location during procedure: OB Start time: 05/26/2020 3:40 PM End time: 05/26/2020 3:54 PM  Staffing Anesthesiologist: Karleen Hampshire, MD Resident/CRNA: Karoline Caldwell, CRNA Performed: resident/CRNA   Preanesthetic Checklist Completed: patient identified, IV checked, site marked, risks and benefits discussed, surgical consent, monitors and equipment checked, pre-op evaluation and timeout performed  Epidural Patient position: sitting Prep: ChloraPrep Patient monitoring: heart rate, continuous pulse ox and blood pressure Approach: midline Location: L3-L4 Injection technique: LOR air  Needle:  Needle type: Tuohy  Needle gauge: 17 G Needle length: 9 cm and 9 Needle insertion depth: 7 cm Catheter type: closed end flexible Catheter size: 19 Gauge Catheter at skin depth: 12 cm Test dose: negative and 1.5% lidocaine with Epi 1:200 K  Assessment Events: blood not aspirated, injection not painful, no injection resistance, no paresthesia and negative IV test  Additional Notes 1 attempt Pt. Evaluated and documentation done after procedure finished. Patient identified. Risks/Benefits/Options discussed with patient including but not limited to bleeding, infection, nerve damage, paralysis, failed block, incomplete pain control, headache, blood pressure changes, nausea, vomiting, reactions to medication both or allergic, itching and postpartum back pain. Confirmed with bedside nurse the patient's most recent platelet count. Confirmed with patient that they are not currently taking any anticoagulation, have any bleeding history or any family history of bleeding disorders. Patient expressed understanding and wished to proceed. All questions were answered. Sterile technique was used throughout the entire procedure. Please see nursing notes for vital signs. Test dose was given through epidural catheter and negative prior to continuing to dose epidural or start infusion. Warning  signs of high block given to the patient including shortness of breath, tingling/numbness in hands, complete motor block, or any concerning symptoms with instructions to call for help. Patient was given instructions on fall risk and not to get out of bed. All questions and concerns addressed with instructions to call with any issues or inadequate analgesia.   Patient tolerated the insertion well without immediate complications.Reason for block:procedure for pain

## 2020-05-26 NOTE — Anesthesia Preprocedure Evaluation (Signed)
Anesthesia Evaluation  Patient identified by MRN, date of birth, ID band Patient awake    Reviewed: Allergy & Precautions, H&P , NPO status , Patient's Chart, lab work & pertinent test results  Airway Mallampati: II  TM Distance: >3 FB Neck ROM: full    Dental no notable dental hx. (+) Teeth Intact   Pulmonary    Pulmonary exam normal        Cardiovascular Normal cardiovascular exam     Neuro/Psych    GI/Hepatic GERD  Medicated,  Endo/Other    Renal/GU      Musculoskeletal   Abdominal   Peds  Hematology negative hematology ROS (+)   Anesthesia Other Findings   Reproductive/Obstetrics (+) Pregnancy                             Anesthesia Physical Anesthesia Plan  ASA: II  Anesthesia Plan: Epidural   Post-op Pain Management:    Induction:   PONV Risk Score and Plan:   Airway Management Planned:   Additional Equipment:   Intra-op Plan:   Post-operative Plan:   Informed Consent:     Dental Advisory Given  Plan Discussed with: Anesthesiologist and CRNA  Anesthesia Plan Comments:         Anesthesia Quick Evaluation

## 2020-05-26 NOTE — Discharge Summary (Signed)
Obstetrical Discharge Summary  Patient Name: Rebecca Zimmerman DOB: 2000-07-05 MRN: [id number]  Date of Admission: 05/26/2020 Date of Delivery: 05/26/2020 Delivered by: Margaretmary Eddy, CNM  Date of Discharge: 05/28/2020  Primary OB: [facility] [id number] last menstrual period was 09/09/2019. EDC Estimated Date of Delivery: 06/15/20 Gestational Age at Delivery: [redacted]w[redacted]d   Antepartum complications:  1. Cholestasis of pregnancy 2. Hx of pilonidal cyst abscess - 03/2020 3. Chlamydia infection in pregnancy - treated 12/02/19, TOC neg 03/15/20   Admitting Diagnosis: cholestasis of pregnancy  Secondary Diagnosis: Patient Active Problem List   Diagnosis Date Noted  . Cholestasis during pregnancy in third trimester 05/05/2020  . Pregnancy, supervision, high-risk, third trimester 03/06/2020    Induction: Pitocin and Cytotec Complications: None Intrapartum complications/course: [place] presented to L&D for scheduled IOL d/t cholestasis in pregnancy.  She progressed well with low dose oxytocin and had rapid cervical change from 4 cm to 10 cm after SROM.  She had a spontaneous urge to push and pushed well over 2 contractions.  Delivery Type: spontaneous vaginal delivery Anesthesia: epidural Placenta: spontaneous Laceration: 1st degree perineal  Episiotomy: none Newborn Data: Live born female "[place]" Birth Weight: 5 lb 14.5 oz (2680 g) APGAR: 8, 8  Newborn Delivery   Birth date/time: 05/26/2020 16:35:00 Delivery type: Vaginal, Spontaneous     Postpartum Procedures: None Edinburgh:  Edinburgh Postnatal Depression Scale Screening Tool 05/27/2020  I have been able to laugh and see the funny side of things. 0  I have looked forward with enjoyment to things. 0  I have blamed myself unnecessarily when things went wrong. 0  I have been anxious or worried for no good reason. 0  I have felt scared or panicky for no good reason. 0  Things have been getting on top of me. 0  I have  been so unhappy that I have had difficulty sleeping. 0  I have felt sad or miserable. 1  I have been so unhappy that I have been crying. 1  The thought of harming myself has occurred to me. 0  Edinburgh Postnatal Depression Scale Total 2     Post partum course:  Patient had an uncomplicated postpartum course.  By time of discharge on PPD#2, her pain was controlled on oral pain medications; she had appropriate lochia and was ambulating, voiding without difficulty and tolerating regular diet.  She was deemed stable for discharge to home.    Discharge Physical Exam:  BP 139/89 (BP Location: Left Arm)   Pulse 88   Temp 98.5 F (36.9 C) (Oral)   Resp 18   Ht 5\' 7"  (1.702 m)   Wt 97.5 kg   LMP 09/09/2019   SpO2 100%   Breastfeeding Unknown   BMI 33.67 kg/m   General: NAD CV: RRR Pulm: CTABL, nl effort ABD: s/nd/nt, fundus firm and below the umbilicus Lochia: moderate Perineum: minimal edema/repair well approximated DVT Evaluation: LE non-ttp, no evidence of DVT on exam.  Hemoglobin  Date Value Ref Range Status  05/27/2020 9.7 (L) 12.0 - 15.0 g/dL Final  05/29/2020 50/06/7046 11.1 - 15.9 g/dL Final   HCT  Date Value Ref Range Status  05/27/2020 28.1 (L) 36.0 - 46.0 % Final   Hematocrit  Date Value Ref Range Status  05/18/2015 35.3 34.0 - 46.6 % Final     Disposition: stable, discharge to home. Baby Feeding: formula Baby Disposition: SCN d/t RDS of newborn   Rh Immune globulin given: Rh pos Rubella vaccine given: Immune Varivax vaccine given:  Immune Flu vaccine given in AP or PP setting: declined  Tdap vaccine given in AP or PP setting: declined   Contraception: OCP's   Prenatal Labs:  Blood type/Rh O Pos  Antibody screen neg  Rubella Immune  Varicella Immune  RPR NR  HBsAg Neg  HIV NR  GC neg  Chlamydia neg  Genetic screening negative  1 hour GTT 106  3 hour GTT   GBS negative     Plan:  ADELAI ACHEY was discharged to home in good  condition. Follow-up appointment with delivering provider in 6 weeks.  Discharge Medications: Allergies as of 05/28/2020   No Known Allergies     Medication List    TAKE these medications   acetaminophen 500 MG tablet Commonly known as: TYLENOL Take 2 tablets (1,000 mg total) by mouth every 6 (six) hours as needed (for pain scale < 4).   ibuprofen 600 MG tablet Commonly known as: ADVIL Take 1 tablet (600 mg total) by mouth every 6 (six) hours as needed.   multivitamin-prenatal 27-0.8 MG Tabs tablet Take 1 tablet by mouth daily at 12 noon.        Follow-up Information    Gustavo Lah, CNM. Schedule an appointment as soon as possible for a visit in 6 week(s).   Specialty: Certified Nurse Midwife Why: postpartum visit  Contact information: [address] [place] [address] [phone]              Signed:  Margaretmary Eddy, CNM Certified Nurse Midwife [facility]

## 2020-05-26 NOTE — H&P (Signed)
OB History & Physical   History of Present Illness:   Chief Complaint: scheduled IOL for cholestasis of pregnancy   HPI:  Rebecca Zimmerman is a 20 y.o. G1P0 female at [redacted]w[redacted]d dated by LMP of 09/08/2020, c/w [place] at [redacted]w[redacted]d.  She presents to L&D for scheduled IOL for cholestasis of pregnancy   Reports active fetal movement  Contractions: irregular cramping  LOF/SROM: denies  Vaginal bleeding: denies   Pregnancy Issues: 1. Cholestasis of pregnancy 2. Hx of pilonidal cyst abscess - 03/2020 3. Chlamydia infection in pregnancy - treated 12/02/19, TOC neg 03/15/20  Patient Active Problem List   Diagnosis Date Noted  . Cholestasis during pregnancy in third trimester 05/05/2020  . Pregnancy, supervision, high-risk, third trimester 03/06/2020     Maternal Medical History:  History reviewed. No pertinent past medical history.  Past Surgical History:  Procedure Laterality Date  . NO PAST SURGERIES      No Known Allergies  Prior to Admission medications   Medication Sig Start Date End Date Taking? Authorizing Provider  cephALEXin (KEFLEX) 500 MG capsule Take 1 capsule (500 mg total) by mouth 3 (three) times daily. 03/07/20  Yes Flinchum, Eula Fried, FNP  ondansetron (ZOFRAN) 4 MG tablet Take 4 mg by mouth every 8 (eight) hours as needed for nausea or vomiting.   Yes [provider]  Prenatal Vit-Fe Fumarate-FA (MULTIVITAMIN-PRENATAL) 27-0.8 MG TABS tablet Take 1 tablet by mouth daily at 12 noon.   Yes [provider]     Pren[facility] care site[facility]  [facility]  Social History: She  reports that she has never smoked. She has never used smokeless tobacco. She reports that she does not drink alcohol and does not use drugs.  Family History: family history includes Epilepsy in her father; Healthy in her brother, brother, brother, and sister.   Review of Systems: A full review of systems was performed and negative except as noted in the HPI.     Physical Exam:   Vital Signs: BP 129/86   Pulse 82   Temp 98.3 F (36.8 C) (Oral)   Resp 15   Ht 5\' 7"  (1.702 m)   Wt 97.5 kg   LMP 09/09/2019   Breastfeeding Unknown   BMI 33.67 kg/m   General: no acute distress.  HEENT: normocephalic, atraumatic Heart: regular rate & rhythm.  No murmurs/rubs/gallops Lungs: clear to auscultation bilaterally, normal respiratory effort Abdomen: soft, gravid, non-tender;  EFW: 7 1/2 lbs  Pelvic:   External: Normal external female genitalia  Cervix: 2/70/-2   Extremities: non-tender, symmetric, mild, dependent edema bilaterally.  DTRs: 2+/2+  Neurologic: Alert & oriented x 3.    Results for orders placed or performed during the hospital encounter of 05/26/20 (from the past 24 hour(s))  CBC     Status: Abnormal   Collection Time: 05/26/20 12:30 AM  Result Value Ref Range   WBC 12.1 (H) 4.0 - 10.5 K/uL   RBC 3.50 (L) 3.87 - 5.11 MIL/uL   Hemoglobin 10.8 (L) 12.0 - 15.0 g/dL   HCT 05/28/20 (L) 52.8 - 41.3 %   MCV 92.0 80.0 - 100.0 fL   MCH 30.9 26.0 - 34.0 pg   MCHC 33.5 30.0 - 36.0 g/dL   RDW 24.4 01.0 - 27.2 %   Platelets 270 150 - 400 K/uL   nRBC 0.0 0.0 - 0.2 %  Type and screen     Status: None   Collection Time: 05/26/20 12:30 AM  Result Value Ref Range  ABO/RH(D) O POS    Antibody Screen NEG    Sample Expiration      05/29/2020,2359 Performed at [facility], [address] [place], [place] [address]   RPR     Status: None   Collection Time: 05/26/20 12:30 AM  Result Value Ref Range   RPR Ser Ql NON REACTIVE NON REACTIVE  Comprehensive metabolic panel     Status: Abnormal   Collection Time: 05/26/20 12:30 AM  Result Value Ref Range   Sodium 135 135 - 145 mmol/L   Potassium 3.7 3.5 - 5.1 mmol/L   Chloride 103 98 - 111 mmol/L   CO2 20 (L) 22 - 32 mmol/L   Glucose, Bld 102 (H) 70 - 99 mg/dL   BUN 9 6 - 20 mg/dL   Creatinine, Ser 2.84 0.44 - 1.00 mg/dL   Calcium 9.6 8.9 - 13.2 mg/dL   Total Protein 7.6 6.5 - 8.1 g/dL   Albumin 2.9  (L) 3.5 - 5.0 g/dL   AST 440 (H) 15 - 41 U/L   ALT 143 (H) 0 - 44 U/L   Alkaline Phosphatase 245 (H) 38 - 126 U/L   Total Bilirubin 1.4 (H) 0.3 - 1.2 mg/dL   GFR, Estimated >10 >27 mL/min   Anion gap 12 5 - 15  Protein / creatinine ratio, urine     Status: Abnormal   Collection Time: 05/26/20 12:30 AM  Result Value Ref Range   Creatinine, Urine 52 mg/dL   Total Protein, Urine 9 mg/dL   Protein Creatinine Ratio 0.17 (H) 0.00 - 0.15 mg/mg[Cre]  ABO/Rh     Status: None   Collection Time: 05/26/20  2:24 AM  Result Value Ref Range   ABO/RH(D)      O POS Performed at [facility], [address], [place], [place] [address]     Pertinent Results:  Prenatal Labs: Blood type/Rh  O Pos  Antibody screen neg  Rubella Immune  Varicella Immune  RPR NR  HBsAg Neg  HIV NR  GC neg  Chlamydia neg  Genetic screening negative  1 hour GTT 106  3 hour GTT   GBS  negative     FHT: Baseline: 135 bpm, Variability: moderate, Accelerations: present and Decelerations: Absent TOCO: 1-3 min, mild contractions  SVE:  2/70/-2   Cephalic by [place] and SVE   No results found.  Assessment:  Rebecca Zimmerman is a 20 y.o. G1P0 female at [redacted]w[redacted]d with cholestasis in pregnancy.   Plan:  1. Admit to Labor & Delivery; consents reviewed and obtained - Covid admission screen neg  2. Fetal Well being  - Fetal Tracing: cat 1 - Group B Streptococcus ppx not indicated: GBS neg - Presentation: cephalic confirmed by SVE   3. Routine OB: - Prenatal labs reviewed, as above - Rh pos - CBC, T&S, RPR on admit - Reg diet, IVF  4. Monitoring of labor  -  Contractions monitored with external toco -  Pelvis adequate for trial of labor  -  Plan for induction with misoprostol -> AROM and oxytocin as appropriate  -  Plan for  continuous fetal monitoring -  Maternal pain control as desired; planning - Anticipate vaginal delivery  5. Post Partum Planning: - Infant feeding: breast and formula  (leaning towards formula) - Contraception: OCP's - Tdap vaccine: declined  - Flu vaccine: declined   Gustavo Lah, CNM 05/26/20 1:11 PM  Margaretmary Eddy, CNM Certified Nurse Midwife [facility]

## 2020-05-27 LAB — CBC
HCT: 28.1 % — ABNORMAL LOW (ref 36.0–46.0)
Hemoglobin: 9.7 g/dL — ABNORMAL LOW (ref 12.0–15.0)
MCH: 31.8 pg (ref 26.0–34.0)
MCHC: 34.5 g/dL (ref 30.0–36.0)
MCV: 92.1 fL (ref 80.0–100.0)
Platelets: 193 10*3/uL (ref 150–400)
RBC: 3.05 MIL/uL — ABNORMAL LOW (ref 3.87–5.11)
RDW: 13.2 % (ref 11.5–15.5)
WBC: 11.6 10*3/uL — ABNORMAL HIGH (ref 4.0–10.5)
nRBC: 0 % (ref 0.0–0.2)

## 2020-05-27 MED ORDER — FERROUS SULFATE 325 (65 FE) MG PO TABS
325.0000 mg | ORAL_TABLET | Freq: Two times a day (BID) | ORAL | Status: DC
  Administered 2020-05-27 – 2020-05-28 (×2): 325 mg via ORAL
  Filled 2020-05-27 (×2): qty 1

## 2020-05-27 NOTE — Progress Notes (Signed)
Post Partum  Day 1  Subjective: no complaints, up ad lib, voiding and tolerating PO  Doing well, no concerns. Ambulating without difficulty, pain managed with PO meds, tolerating regular diet, and voiding without difficulty. Visiting infant in SCN.  No fever/chills, chest pain, shortness of breath, nausea/vomiting, or leg pain. No nipple or breast pain. No headache, visual changes, or RUQ/epigastric pain.  Objective: BP 128/89 (BP Location: Right Arm)   Pulse 71   Temp 98.2 F (36.8 C) (Oral)   Resp 18   Ht 5\' 7"  (1.702 m)   Wt 97.5 kg   LMP 09/09/2019   SpO2 99%   Breastfeeding Unknown   BMI 33.67 kg/m    Physical Exam:  General: alert, cooperative and no distress Breasts: soft/nontender CV: RRR Pulm: nl effort, CTABL Abdomen: soft, non-tender, active bowel sounds Uterine Fundus: firm Perineum: minimal edema, repair well approximated Lochia: appropriate DVT Evaluation: No evidence of DVT seen on physical exam.  Recent Labs    05/26/20 0030 05/27/20 0608  HGB 10.8* 9.7*  HCT 32.2* 28.1*  WBC 12.1* 11.6*  PLT 270 193    Assessment/Plan: 20 y.o. G1P1001 postpartum day # 1  -Continue routine postpartum care -Encouraged to continue visits to SCN and skin to skin with infant  -Encouraged snug fitting bra, cold application, Tylenol PRN, and cabbage leaves for engorgement for formula feeding .  -Acute blood loss anemia - hemodynamically stable and asymptomatic; start PO ferrous sulfate BID with stool softeners    Disposition: Continue inpatient postpartum care    LOS: 1 day   12, CNM 05/27/2020, 11:08 AM   ----- 05/29/2020  Certified Nurse Midwife [facility]

## 2020-05-27 NOTE — Anesthesia Postprocedure Evaluation (Signed)
Anesthesia Post Note  Patient: Rebecca Zimmerman  Procedure(s) Performed: AN AD HOC LABOR EPIDURAL  Patient location during evaluation: Mother Baby Anesthesia Type: Epidural Level of consciousness: awake and alert, awake and oriented Pain management: pain level controlled Vital Signs Assessment: post-procedure vital signs reviewed and stable Respiratory status: spontaneous breathing, nonlabored ventilation and respiratory function stable Cardiovascular status: blood pressure returned to baseline and stable Postop Assessment: no headache and no backache Anesthetic complications: no   No complications documented.   Last Vitals:  Vitals:   05/27/20 0311 05/27/20 0822  BP: 129/86 128/89  Pulse: 71 71  Resp: 20 18  Temp: 36.5 C 36.8 C  SpO2: 95% 99%    Last Pain:  Vitals:   05/27/20 1238  TempSrc:   PainSc: 0-No pain                 [employer]

## 2020-05-28 MED ORDER — ACETAMINOPHEN 500 MG PO TABS: 1000.0000 mg | ORAL_TABLET | Freq: Four times a day (QID) | ORAL | 0 refills | Status: DC | PRN

## 2020-05-28 MED ORDER — IBUPROFEN 600 MG PO TABS: 600.0000 mg | ORAL_TABLET | Freq: Four times a day (QID) | ORAL | 1 refills | Status: DC | PRN

## 2020-05-28 NOTE — Plan of Care (Signed)
  Problem: Education: Goal: Knowledge of General Education information will improve Description: Including pain rating scale, medication(s)/side effects and non-pharmacologic comfort measures 05/28/2020 0026 by Justus [facility], RN Outcome: Progressing 05/28/2020 0024 by Justus [facility], RN Outcome: Progressing   Problem: Clinical Measurements: Goal: Will remain free from infection 05/28/2020 0026 by Justus Memory, RN Outcome: Progressing 05/28/2020 0024 by Justus Memory, RN Outcome: Progressing   Problem: Nutrition: Goal: Adequate nutrition will be maintained 05/28/2020 0026 by Justus Memory, RN Outcome: Progressing 05/28/2020 0024 by Justus Memory, RN Outcome: Progressing

## 2020-06-28 ENCOUNTER — Ambulatory Visit: Payer: BC Managed Care – PPO | Admitting: Adult Health

## 2020-07-06 DIAGNOSIS — Z3009 Encounter for other general counseling and advice on contraception: Secondary | ICD-10-CM | POA: Diagnosis not present

## 2020-07-06 DIAGNOSIS — Z30011 Encounter for initial prescription of contraceptive pills: Secondary | ICD-10-CM | POA: Diagnosis not present

## 2020-07-06 DIAGNOSIS — Z1332 Encounter for screening for maternal depression: Secondary | ICD-10-CM | POA: Diagnosis not present

## 2021-01-16 DIAGNOSIS — Z331 Pregnant state, incidental: Secondary | ICD-10-CM | POA: Diagnosis not present

## 2021-01-16 DIAGNOSIS — N912 Amenorrhea, unspecified: Secondary | ICD-10-CM | POA: Diagnosis not present

## 2021-01-16 DIAGNOSIS — Z640 Problems related to unwanted pregnancy: Secondary | ICD-10-CM | POA: Diagnosis not present

## 2021-04-08 NOTE — L&D Delivery Note (Signed)
Delivery Note  First Stage: Labor onset: 1300 Augmentation: none Analgesia /Anesthesia intrapartum: none SROM at 1255  Second Stage: Complete dilation at 1322 Onset of pushing at 1322 FHR second stage Cat I  Delivery of a viable female infant on 09/07/21 at 1325 by CNM delivery of fetal head in ROA position with restitution to ROT. No nuchal cord;  Anterior then posterior shoulders delivered easily with gentle downward traction. Baby placed on mom's chest, and attended to by peds.  Cord double clamped after cessation of pulsation, cut by CNM Cord blood sample collected    Third Stage: Placenta delivered spontaneously intact with 3VC @ 1329 Placenta disposition: spontaneous Uterine tone Firm / bleeding small  No vaginal cervical or perineal laceration identified  Anesthesia for repair: n/a  Est. Blood Loss (mL): 150  Complications: no PNC, precipitous birth  Mom to postpartum.  Baby to Couplet care / Skin to Skin.  Newborn: Birth Weight: pending  Apgar Scores: 8/9 Feeding planned: formula

## 2021-04-23 NOTE — Progress Notes (Signed)
Acute Office Visit  Subjective:    Patient ID: Rebecca Zimmerman, female    DOB: 03/06/2001, 20 y.o.   MRN: [id number]  Introduced myself to the patient as a PA-C and provided education on APPs in clinical practice.    Chief Complaint  Patient presents with   Sore Throat    HPI Patient is in today for evaluation of sore throat. She states her symptoms began 2 days ago.  She reports having white spots on her tonsils.  Felt strongly that she had strept.  She states that since then it has gotten a little better.  However, now she has pain in her left ear and behind the left ear.  She denies any fever, congestion or cough. She describes her ear as feeling like it needs to pop or that it has something in it.   Rapid strep negative  States sore throat began Sunday Denies recent sick contacts Reports congestion, ear pain and fullness, mild postnasal drainage,  Denies fever, fatigue, cough, headaches, body aches States it hurts to swallow due to sore throat Has not performed a COVID test recently   States she has tried a "cold and flu" formulation without relief   No past medical history on file.  Past Surgical History:  Procedure Laterality Date   NO PAST SURGERIES      Family History  Problem Relation Age of Onset   Epilepsy Father    Healthy Sister    Healthy Brother    Healthy Brother    Healthy Brother     Social History   Socioeconomic History   Marital status: Single    Spouse name: Not on file   Number of children: Not on file   Years of education: Not on file   Highest education level: Not on file  Occupational History   Not on file  Tobacco Use   Smoking status: Never   Smokeless tobacco: Never  Substance and Sexual Activity   Alcohol use: No    Alcohol/week: 0.0 standard drinks   Drug use: No   Sexual activity: Never    Birth control/protection: Pill    Comment: considering pills   Other Topics Concern   Not on file  Social History Narrative    Not on file   Social Determinants of Health   Financial Resource Strain: Not on file  Food Insecurity: Not on file  Transportation Needs: Not on file  Physical Activity: Not on file  Stress: Not on file  Social Connections: Not on file  Intimate Partner Violence: Not on file    Outpatient Medications Prior to Visit  Medication Sig Dispense Refill   acetaminophen (TYLENOL) 500 MG tablet Take 2 tablets (1,000 mg total) by mouth every 6 (six) hours as needed (for pain scale < 4). 30 tablet 0   ibuprofen (ADVIL) 600 MG tablet Take 1 tablet (600 mg total) by mouth every 6 (six) hours as needed. 30 tablet 1   Prenatal Vit-Fe Fumarate-FA (MULTIVITAMIN-PRENATAL) 27-0.8 MG TABS tablet Take 1 tablet by mouth daily at 12 noon.     No facility-administered medications prior to visit.    No Known Allergies  Review of Systems  Constitutional:  Positive for fatigue. Negative for chills, diaphoresis and fever.  HENT:  Positive for congestion, ear pain and sore throat. Negative for ear discharge, facial swelling, postnasal drip, rhinorrhea, sinus pressure and sinus pain.   Respiratory:  Negative for cough and shortness of breath.   Cardiovascular:  Negative  for chest pain.  Gastrointestinal:  Negative for diarrhea, nausea and vomiting.  Musculoskeletal:  Negative for arthralgias, myalgias, neck pain and neck stiffness.  Skin:  Negative for rash.  Neurological:  Negative for dizziness, light-headedness and headaches.      Objective:    Physical Exam Constitutional:      General: She is not in acute distress.    Appearance: She is well-developed. She is obese.  HENT:     Head: Normocephalic and atraumatic.     Right Ear: Ear canal normal. A middle ear effusion is present.     Left Ear: Ear canal normal. A middle ear effusion is present.     Nose: Congestion present. No rhinorrhea.     Mouth/Throat:     Mouth: Mucous membranes are moist.     Pharynx: Posterior oropharyngeal erythema  present. No pharyngeal swelling, oropharyngeal exudate or uvula swelling.     Tonsils: No tonsillar exudate or tonsillar abscesses.  Eyes:     Conjunctiva/sclera: Conjunctivae normal.     Pupils: Pupils are equal, round, and reactive to light.  Cardiovascular:     Rate and Rhythm: Normal rate and regular rhythm.     Heart sounds: Normal heart sounds.  Pulmonary:     Effort: Pulmonary effort is normal.     Breath sounds: Normal breath sounds. No wheezing, rhonchi or rales.  Musculoskeletal:     Cervical back: Normal range of motion and neck supple.  Lymphadenopathy:     Head:     Right side of head: No submental or submandibular adenopathy.     Left side of head: No submental or submandibular adenopathy.     Upper Body:     Right upper body: No supraclavicular adenopathy.     Left upper body: No supraclavicular adenopathy.  Neurological:     Mental Status: She is alert.  Psychiatric:        Mood and Affect: Mood normal.        Behavior: Behavior normal.    BP (!) 127/59 (BP Location: Right Arm, Patient Position: Sitting, Cuff Size: Large)    Pulse 93    Temp 99 F (37.2 C) (Oral)    Wt 217 lb (98.4 kg)    SpO2 100%    BMI 33.99 kg/m  Wt Readings from Last 3 Encounters:  04/24/21 217 lb (98.4 kg)  05/25/20 215 lb (97.5 kg) (98 %, Z= 2.15)*  03/07/20 207 lb 12.8 oz (94.3 kg) (98 %, Z= 2.06)*   * Growth percentiles are based on CDC (Girls, 2-20 Years) data.    Health Maintenance Due  Topic Date Due   COVID-19 Vaccine (1) Never done   Pneumococcal Vaccine 57-63 Years old (1 - PCV) Never done   HPV VACCINES (3 - 2-dose series) 11/16/2012   Hepatitis C Screening  Never done   INFLUENZA VACCINE  11/06/2020       Topic Date Due   HPV VACCINES (3 - 2-dose series) 11/16/2012     No results found for: TSH Lab Results  Component Value Date   WBC 11.6 (H) 05/27/2020   HGB 9.7 (L) 05/27/2020   HCT 28.1 (L) 05/27/2020   MCV 92.1 05/27/2020   PLT 193 05/27/2020   Lab  Results  Component Value Date   NA 135 05/26/2020   K 3.7 05/26/2020   CO2 20 (L) 05/26/2020   GLUCOSE 102 (H) 05/26/2020   BUN 9 05/26/2020   CREATININE 0.60 05/26/2020   BILITOT 1.4 (H)  05/26/2020   ALKPHOS 245 (H) 05/26/2020   AST 110 (H) 05/26/2020   ALT 143 (H) 05/26/2020   PROT 7.6 05/26/2020   ALBUMIN 2.9 (L) 05/26/2020   CALCIUM 9.6 05/26/2020   ANIONGAP 12 05/26/2020   No results found for: CHOL No results found for: HDL No results found for: LDLCALC No results found for: TRIG No results found for: CHOLHDL No results found for: HQPR9F     Assessment & Plan:   Problem List Items Addressed This Visit   None Visit Diagnoses     Sore throat    -  Primary   Relevant Orders   POCT rapid strep A (Completed)      1. Sore throat Acute with associated nasal congestion, ear pain since Sunday  Has used OTC multi-symptoms relief medications without definitive relief Rapid strep and influenza negative  COVID result pending At this time recommend symptomatic management with OTC medications, rest, hydration  Follow up if symptoms are not improving or worsening in 5-7 days  - POCT rapid strep A - POCT Influenza A/B  2. Nasal congestion Acute with associated sore throat, ear pain since Sunday  Has used OTC multi-symptoms relief medications without definitive relief Rapid strep and influenza negative  COVID result pending Most likely URI of viral etiology  At this time recommend symptomatic management with OTC medications, rest, hydration  Follow up if symptoms are not improving or worsening in 5-7 days  - POCT Influenza A/B   The entirety of the information documented in the History of Present Illness, Review of Systems and Physical Exam were personally obtained by me. Portions of this information were initially documented by the CMA and reviewed by me for thoroughness and accuracy.   Providence Crosby, PA-C [employer]

## 2021-04-24 ENCOUNTER — Ambulatory Visit (INDEPENDENT_AMBULATORY_CARE_PROVIDER_SITE_OTHER): Payer: BC Managed Care – PPO | Admitting: Physician Assistant

## 2021-04-24 ENCOUNTER — Other Ambulatory Visit: Payer: Self-pay

## 2021-04-24 VITALS — BP 127/59 | HR 93 | Temp 99.0°F | Wt 217.0 lb

## 2021-04-24 DIAGNOSIS — J029 Acute pharyngitis, unspecified: Secondary | ICD-10-CM

## 2021-04-24 DIAGNOSIS — R0981 Nasal congestion: Secondary | ICD-10-CM

## 2021-04-24 LAB — POCT INFLUENZA A/B
Influenza A, POC: NEGATIVE
Influenza B, POC: NEGATIVE

## 2021-04-24 LAB — POCT RAPID STREP A (OFFICE): Rapid Strep A Screen: NEGATIVE

## 2021-04-24 NOTE — Patient Instructions (Signed)
Based on your described symptoms and the duration of symptoms it is likely that you have a viral upper respiratory infection (often called a "cold")  Symptoms can last for 3-10 days with lingering cough and intermittent symptoms lasting weeks after that.  The goal of treatment at this time is to reduce your symptoms and discomfort    You can use over the counter medications such as Dayquil/Nyquil, AlkaSeltzer formulations, etc to provide further relief of symptoms according to the manufacturer's instructions  If preferred you can use Coricidin to manage your symptoms rather than those medications mentioned above.  Saline nasal sprays and humidifiers can be used to help reduce nasal irritation   Get plenty of rest and stay well hydrated while you are recovering You can drink/eat whichever foods (warm or cool) depending on what soothes your throat more effectively    If your symptoms do not improve or become worse in the next 5-7 days please make an apt at the office so we can see you  Go to the ER if you begin to have more serious symptoms such as shortness of breath, trouble breathing, loss of consciousness, swelling around the eyes, high fever, severe lasting headaches, vision changes or neck pain/stiffness.

## 2021-08-24 ENCOUNTER — Ambulatory Visit (INDEPENDENT_AMBULATORY_CARE_PROVIDER_SITE_OTHER): Payer: BC Managed Care – PPO | Admitting: Family Medicine

## 2021-08-24 ENCOUNTER — Ambulatory Visit: Payer: Self-pay

## 2021-08-24 ENCOUNTER — Encounter: Payer: Self-pay | Admitting: Family Medicine

## 2021-08-24 VITALS — BP 126/80 | HR 80 | Temp 97.9°F | Resp 16 | Wt 217.4 lb

## 2021-08-24 DIAGNOSIS — F418 Other specified anxiety disorders: Secondary | ICD-10-CM | POA: Diagnosis not present

## 2021-08-24 NOTE — Assessment & Plan Note (Signed)
Significant other currently being treated for H Pylori infection Patient without any current symptoms Vital remain stable Patient denies any GI symptoms or complaints Patient is not interested in blood, breath or stool testing at this time Encouraged patient to ensure that partner is tested for eradication s/p treatment

## 2021-08-24 NOTE — Progress Notes (Signed)
I,Rebecca Zimmerman,acting as a Neurosurgeon for Rebecca Kindle, FNP.,have documented all relevant documentation on the behalf of Rebecca Kindle, FNP,as directed by  Rebecca Kindle, FNP while in the presence of Rebecca Kindle, FNP.  Established patient visit   Patient: Rebecca Zimmerman   DOB: Aug 24, 2000   20 y.o. Female  MRN: [id number] Visit Date: 08/24/2021  Today's healthcare provider: [name], FNP  Patient presents for new patient visit to establish care.  Introduced to Publishing rights manager role and practice setting.  All questions answered.  Discussed provider/patient relationship and expectations.   No chief complaint on file.  Subjective    HPI  Patient present today stating that finance was diagnosed with H.plyori on Wednesday 08-22-21. Was advised by the ED to contact provider to see if she could have H. Plyori. Patient is asymptomatic.     Medications: Outpatient Medications Prior to Visit  Medication Sig   acetaminophen (TYLENOL) 500 MG tablet Take 2 tablets (1,000 mg total) by mouth every 6 (six) hours as needed (for pain scale < 4).   ibuprofen (ADVIL) 600 MG tablet Take 1 tablet (600 mg total) by mouth every 6 (six) hours as needed.   Prenatal Vit-Fe Fumarate-FA (MULTIVITAMIN-PRENATAL) 27-0.8 MG TABS tablet Take 1 tablet by mouth daily at 12 noon.   No facility-administered medications prior to visit.    Review of Systems     Objective    BP 126/80 (BP Location: Right Arm, Patient Position: Sitting, Cuff Size: Normal)   Pulse 80   Temp 97.9 F (36.6 C) (Oral)   Resp 16   Wt 217 lb 6.4 oz (98.6 kg)   SpO2 98%   BMI 34.05 kg/m    Physical Exam Vitals and nursing note reviewed.  Constitutional:      General: She is not in acute distress.    Appearance: Normal appearance. She is obese. She is not ill-appearing, toxic-appearing or diaphoretic.  HENT:     Head: Normocephalic and atraumatic.  Cardiovascular:     Rate and Rhythm: Normal rate and regular  rhythm.     Pulses: Normal pulses.     Heart sounds: Normal heart sounds. No murmur heard.   No friction rub. No gallop.  Pulmonary:     Effort: Pulmonary effort is normal. No respiratory distress.     Breath sounds: Normal breath sounds. No stridor. No wheezing, rhonchi or rales.  Chest:     Chest wall: No tenderness.  Abdominal:     General: Bowel sounds are normal.     Palpations: Abdomen is soft.  Musculoskeletal:        General: No swelling, tenderness, deformity or signs of injury. Normal range of motion.     Right lower leg: No edema.     Left lower leg: No edema.  Skin:    General: Skin is warm and dry.     Capillary Refill: Capillary refill takes less than 2 seconds.     Coloration: Skin is not jaundiced or pale.     Findings: No bruising, erythema, lesion or rash.  Neurological:     General: No focal deficit present.     Mental Status: She is alert and oriented to person, place, and time. Mental status is at baseline.     Cranial Nerves: No cranial nerve deficit.     Sensory: No sensory deficit.     Motor: No weakness.     Coordination: Coordination normal.  Psychiatric:  Mood and Affect: Mood normal.        Behavior: Behavior normal.        Thought Content: Thought content normal.        Judgment: Judgment normal.     No results found for any visits on 08/24/21.  Assessment & Plan     Problem List Items Addressed This Visit       Other   Anxiety about health - Primary    Significant other currently being treated for H Pylori infection Patient without any current symptoms Vital remain stable Patient denies any GI symptoms or complaints Patient is not interested in blood, breath or stool testing at this time Encouraged patient to ensure that partner is tested for eradication s/p treatment          Return for annual examination.      Rebecca Merl, FNP, have reviewed all documentation for this visit. The documentation on 08/24/21 for the  exam, diagnosis, procedures, and orders are all accurate and complete.    Rebecca Kindle, FNP  [facility] [phone] (phone) [phone] (fax)  [facility]

## 2021-08-24 NOTE — Telephone Encounter (Signed)
  Chief Complaint: H.Pylori testing transmission from fiance Symptoms: none Frequency:  Pertinent Negatives: Patient denies Any S/s. Disposition: [] ED /[] Urgent Care (no appt availability in office) / [x] Appointment(In office/virtual)/ []  [place] [facility]/ [] Home Care/ [] Refused Recommended Disposition /[] [place] [facility]/ []  Follow-up with PCP Additional Notes: Pt's finance has H.Pylori and pt has fears that she may have contracted it. Would like testing.   Summary: H. Plyori Advice   Pt is calling because her finance was dx with H.plyori. Was advised by the ED to contact provider to see if she could have H. Plyori. Pt reports no sx. Please advise      Reason for Disposition  [1] Caller requesting NON-URGENT health information AND [2] PCP's office is the best resource  Answer Assessment - Initial Assessment Questions 1. REASON FOR CALL or QUESTION: "What is your reason for calling today?" or "How can I best help you?" or "What question do you have that I can help answer?"     Pt is afraid of getting H. Pylori from fiance.  Protocols used: Information Only Call - No Triage-A-AH

## 2021-09-07 ENCOUNTER — Inpatient Hospital Stay
Admission: EM | Admit: 2021-09-07 | Discharge: 2021-09-08 | DRG: 806 | Disposition: A | Discharge status: Home / Self Care | Payer: BC Managed Care – PPO | Attending: Obstetrics and Gynecology | Admitting: Obstetrics and Gynecology

## 2021-09-07 ENCOUNTER — Encounter: Payer: Self-pay | Admitting: Medical Oncology

## 2021-09-07 ENCOUNTER — Other Ambulatory Visit: Payer: Self-pay

## 2021-09-07 DIAGNOSIS — O99214 Obesity complicating childbirth: Secondary | ICD-10-CM | POA: Diagnosis not present

## 2021-09-07 DIAGNOSIS — Z3A39 39 weeks gestation of pregnancy: Secondary | ICD-10-CM | POA: Diagnosis not present

## 2021-09-07 DIAGNOSIS — D62 Acute posthemorrhagic anemia: Secondary | ICD-10-CM | POA: Diagnosis not present

## 2021-09-07 DIAGNOSIS — Z8719 Personal history of other diseases of the digestive system: Secondary | ICD-10-CM

## 2021-09-07 DIAGNOSIS — O26893 Other specified pregnancy related conditions, third trimester: Secondary | ICD-10-CM | POA: Diagnosis not present

## 2021-09-07 DIAGNOSIS — O9081 Anemia of the puerperium: Secondary | ICD-10-CM | POA: Diagnosis not present

## 2021-09-07 DIAGNOSIS — R030 Elevated blood-pressure reading, without diagnosis of hypertension: Secondary | ICD-10-CM | POA: Diagnosis present

## 2021-09-07 DIAGNOSIS — O9279 Other disorders of lactation: Secondary | ICD-10-CM | POA: Diagnosis not present

## 2021-09-07 DIAGNOSIS — O139 Gestational [pregnancy-induced] hypertension without significant proteinuria, unspecified trimester: Secondary | ICD-10-CM | POA: Diagnosis present

## 2021-09-07 DIAGNOSIS — O0933 Supervision of pregnancy with insufficient antenatal care, third trimester: Secondary | ICD-10-CM

## 2021-09-07 DIAGNOSIS — R1030 Lower abdominal pain, unspecified: Secondary | ICD-10-CM | POA: Diagnosis not present

## 2021-09-07 LAB — PROTEIN / CREATININE RATIO, URINE
Creatinine, Urine: 75 mg/dL
Protein Creatinine Ratio: 0.28 mg/mg{Cre} — ABNORMAL HIGH (ref 0.00–0.15)
Total Protein, Urine: 21 mg/dL

## 2021-09-07 LAB — RAPID HIV SCREEN (HIV 1/2 AB+AG)
HIV 1/2 Antibodies: NONREACTIVE
HIV-1 P24 Antigen - HIV24: NONREACTIVE

## 2021-09-07 LAB — COMPREHENSIVE METABOLIC PANEL
ALT: 130 U/L — ABNORMAL HIGH (ref 0–44)
AST: 54 U/L — ABNORMAL HIGH (ref 15–41)
Albumin: 3 g/dL — ABNORMAL LOW (ref 3.5–5.0)
Alkaline Phosphatase: 238 U/L — ABNORMAL HIGH (ref 38–126)
Anion gap: 10 (ref 5–15)
BUN: 7 mg/dL (ref 6–20)
CO2: 23 mmol/L (ref 22–32)
Calcium: 9.7 mg/dL (ref 8.9–10.3)
Chloride: 105 mmol/L (ref 98–111)
Creatinine, Ser: 0.55 mg/dL (ref 0.44–1.00)
GFR, Estimated: >60 mL/min (ref >60)
Glucose, Bld: 94 mg/dL (ref 70–99)
Potassium: 3.9 mmol/L (ref 3.5–5.1)
Sodium: 138 mmol/L (ref 135–145)
Total Bilirubin: 1.3 mg/dL — ABNORMAL HIGH (ref 0.3–1.2)
Total Protein: 8.1 g/dL (ref 6.5–8.1)

## 2021-09-07 LAB — CBC
HCT: 34 % — ABNORMAL LOW (ref 36.0–46.0)
Hemoglobin: 11 g/dL — ABNORMAL LOW (ref 12.0–15.0)
MCH: 29.1 pg (ref 26.0–34.0)
MCHC: 32.4 g/dL (ref 30.0–36.0)
MCV: 89.9 fL (ref 80.0–100.0)
Platelets: 275 10*3/uL (ref 150–400)
RBC: 3.78 MIL/uL — ABNORMAL LOW (ref 3.87–5.11)
RDW: 13.7 % (ref 11.5–15.5)
WBC: 10.6 10*3/uL — ABNORMAL HIGH (ref 4.0–10.5)
nRBC: 0 % (ref 0.0–0.2)

## 2021-09-07 LAB — CHLAMYDIA/NGC RT PCR (ARMC ONLY)
Chlamydia Tr: NOT DETECTED
N gonorrhoeae: NOT DETECTED

## 2021-09-07 LAB — URINE DRUG SCREEN, QUALITATIVE (ARMC ONLY)
Amphetamines, Ur Screen: NOT DETECTED
Barbiturates, Ur Screen: NOT DETECTED
Benzodiazepine, Ur Scrn: NOT DETECTED
Cannabinoid 50 Ng, Ur ~~LOC~~: NOT DETECTED
Cocaine Metabolite,Ur ~~LOC~~: NOT DETECTED
MDMA (Ecstasy)Ur Screen: NOT DETECTED
Methadone Scn, Ur: NOT DETECTED
Opiate, Ur Screen: NOT DETECTED
Phencyclidine (PCP) Ur S: NOT DETECTED
Tricyclic, Ur Screen: NOT DETECTED

## 2021-09-07 LAB — TYPE AND SCREEN
ABO/RH(D): O POS
Antibody Screen: NEGATIVE

## 2021-09-07 LAB — GROUP B STREP BY PCR: Group B strep by PCR: NEGATIVE

## 2021-09-07 MED ORDER — ACETAMINOPHEN 325 MG PO TABS
650.0000 mg | ORAL_TABLET | ORAL | Status: DC | PRN
  Administered 2021-09-07 – 2021-09-08 (×3): 650 mg via ORAL
  Filled 2021-09-07 (×4): qty 2

## 2021-09-07 MED ORDER — COCONUT OIL OIL: 1.0000 "application " | TOPICAL_OIL | Status: DC | PRN

## 2021-09-07 MED ORDER — TETANUS-DIPHTH-ACELL PERTUSSIS 5-2.5-18.5 LF-MCG/0.5 IM SUSY
0.5000 mL | PREFILLED_SYRINGE | Freq: Once | INTRAMUSCULAR | Status: DC
  Filled 2021-09-07: qty 0.5

## 2021-09-07 MED ORDER — IBUPROFEN 600 MG PO TABS: 600.0000 mg | ORAL_TABLET | Freq: Four times a day (QID) | ORAL | Status: DC

## 2021-09-07 MED ORDER — LACTATED RINGERS IV SOLN: 500.0000 mL | INTRAVENOUS | Status: DC | PRN

## 2021-09-07 MED ORDER — ONDANSETRON HCL 4 MG PO TABS: 4.0000 mg | ORAL_TABLET | ORAL | Status: DC | PRN

## 2021-09-07 MED ORDER — BENZOCAINE-MENTHOL 20-0.5 % EX AERO
1.0000 "application " | INHALATION_SPRAY | CUTANEOUS | Status: DC | PRN
  Administered 2021-09-07: 1 via TOPICAL
  Filled 2021-09-07 (×2): qty 56

## 2021-09-07 MED ORDER — SENNOSIDES-DOCUSATE SODIUM 8.6-50 MG PO TABS
2.0000 | ORAL_TABLET | Freq: Every day | ORAL | Status: DC
  Administered 2021-09-08: 2 via ORAL
  Filled 2021-09-07: qty 2

## 2021-09-07 MED ORDER — LIDOCAINE HCL (PF) 1 % IJ SOLN
INTRAMUSCULAR | Status: AC
  Filled 2021-09-07: qty 30

## 2021-09-07 MED ORDER — OXYTOCIN 10 UNIT/ML IJ SOLN
INTRAMUSCULAR | Status: AC
  Filled 2021-09-07: qty 2

## 2021-09-07 MED ORDER — OXYTOCIN-SODIUM CHLORIDE 30-0.9 UT/500ML-% IV SOLN
2.5000 [IU]/h | INTRAVENOUS | Status: DC
  Administered 2021-09-07: 2.5 [IU]/h via INTRAVENOUS
  Filled 2021-09-07: qty 500

## 2021-09-07 MED ORDER — AMMONIA AROMATIC IN INHA
RESPIRATORY_TRACT | Status: AC
  Filled 2021-09-07: qty 10

## 2021-09-07 MED ORDER — ONDANSETRON HCL 4 MG/2ML IJ SOLN: 4.0000 mg | INTRAMUSCULAR | Status: DC | PRN

## 2021-09-07 MED ORDER — ONDANSETRON HCL 4 MG/2ML IJ SOLN: 4.0000 mg | Freq: Four times a day (QID) | INTRAMUSCULAR | Status: DC | PRN

## 2021-09-07 MED ORDER — SOD CITRATE-CITRIC ACID 500-334 MG/5ML PO SOLN: 30.0000 mL | ORAL | Status: DC | PRN

## 2021-09-07 MED ORDER — ZOLPIDEM TARTRATE 5 MG PO TABS: 5.0000 mg | ORAL_TABLET | Freq: Every evening | ORAL | Status: DC | PRN

## 2021-09-07 MED ORDER — WITCH HAZEL-GLYCERIN EX PADS
1.0000 "application " | MEDICATED_PAD | CUTANEOUS | Status: DC | PRN
  Filled 2021-09-07: qty 100

## 2021-09-07 MED ORDER — PRENATAL MULTIVITAMIN CH: 1.0000 | ORAL_TABLET | Freq: Every day | ORAL | Status: DC

## 2021-09-07 MED ORDER — MEDROXYPROGESTERONE ACETATE 150 MG/ML IM SUSP: 150.0000 mg | INTRAMUSCULAR | Status: DC | PRN

## 2021-09-07 MED ORDER — OXYTOCIN BOLUS FROM INFUSION
333.0000 mL | Freq: Once | INTRAVENOUS | Status: AC
  Administered 2021-09-07: 333 mL via INTRAVENOUS

## 2021-09-07 MED ORDER — DIPHENHYDRAMINE HCL 25 MG PO CAPS: 25.0000 mg | ORAL_CAPSULE | Freq: Four times a day (QID) | ORAL | Status: DC | PRN

## 2021-09-07 MED ORDER — SIMETHICONE 80 MG PO CHEW: 80.0000 mg | CHEWABLE_TABLET | ORAL | Status: DC | PRN

## 2021-09-07 MED ORDER — ACETAMINOPHEN 325 MG PO TABS: 650.0000 mg | ORAL_TABLET | ORAL | Status: DC | PRN

## 2021-09-07 MED ORDER — DIBUCAINE (PERIANAL) 1 % EX OINT: 1.0000 "application " | TOPICAL_OINTMENT | CUTANEOUS | Status: DC | PRN

## 2021-09-07 MED ORDER — MISOPROSTOL 200 MCG PO TABS
ORAL_TABLET | ORAL | Status: AC
  Filled 2021-09-07: qty 4

## 2021-09-07 MED ORDER — FERROUS SULFATE 325 (65 FE) MG PO TABS
325.0000 mg | ORAL_TABLET | Freq: Two times a day (BID) | ORAL | Status: DC
  Administered 2021-09-08: 325 mg via ORAL
  Filled 2021-09-07 (×2): qty 1

## 2021-09-07 MED ORDER — IBUPROFEN 600 MG PO TABS
ORAL_TABLET | ORAL | Status: AC
  Administered 2021-09-07: 600 mg via ORAL
  Filled 2021-09-07: qty 1

## 2021-09-07 MED ORDER — LIDOCAINE HCL (PF) 1 % IJ SOLN: 30.0000 mL | INTRAMUSCULAR | Status: DC | PRN

## 2021-09-07 MED ORDER — IBUPROFEN 600 MG PO TABS
600.0000 mg | ORAL_TABLET | Freq: Four times a day (QID) | ORAL | Status: DC
  Administered 2021-09-07 – 2021-09-08 (×3): 600 mg via ORAL
  Filled 2021-09-07 (×3): qty 1

## 2021-09-07 MED ORDER — LACTATED RINGERS IV SOLN: INTRAVENOUS | Status: DC

## 2021-09-07 NOTE — OB Triage Note (Signed)
Pt had +PT in Sept. Seen by [facility] in Oct and was told she would probably miscarry due to size of baby. Pt reports heaving bleeding 2 weeks later and assumed she miscarried. Pt reports bleeding monthly for 2-3 days. Pt reports no preg symptoms. Pt reports she has been taking a BC pill.

## 2021-09-07 NOTE — H&P (Signed)
OB History & Physical   History of Present Illness:  Chief Complaint:   HPI:  Rebecca Zimmerman is a 21 y.o. G72P1001 female at [redacted]w[redacted]d dated by [place] at [redacted]w[redacted]d.  She presents to L&D for lower abdominal pain that started yesterday and worsened today.   - Feeling painful UCs and rectal pressure now.     Pregnancy Issues: 1. NO prenatal care this preg- dating US done 01/2021 2. Obesity 3. Prior hx cholestasis 05/2020, closely spaced pregnancies.    Maternal Medical History:  History reviewed. No pertinent past medical history.  Past Surgical History:  Procedure Laterality Date   NO PAST SURGERIES      No Known Allergies  Prior to Admission medications   Not on File     Prenatal care site: [facility] for dating [place]  Social History: She  reports that she has never smoked. She has never used smokeless tobacco. She reports that she does not drink alcohol and does not use drugs.  Family History: family history includes Epilepsy in her father; Healthy in her brother, brother, brother, and sister.   Review of Systems: A full review of systems was performed and negative except as noted in the HPI.     Physical Exam:  Vital Signs: BP 133/87   Pulse 76   Temp 98.7 F (37.1 C) (Oral)   Resp 18   Ht 5\' 7"  (1.702 m)   Wt 98.4 kg   LMP 01/04/2021 (Approximate)   SpO2 100%   BMI 33.99 kg/m  General: no acute distress.  HEENT: normocephalic, atraumatic Heart: regular rate & rhythm.  No murmurs/rubs/gallops Lungs: clear to auscultation bilaterally, normal respiratory effort Abdomen: soft, gravid, non-tender;  EFW: 8lbs Pelvic: Dilation: 8 Effacement (%): 100 Cervical Position: Anterior Station: Plus 1 Presentation: Vertex Exam by:: Imre Vecchione, CNM    Extremities: non-tender, symmetric, no edema bilaterally.  DTRs: 2+  Neurologic: Alert & oriented x 3.    Results for orders placed or performed during the hospital encounter of 09/07/21 (from the past 24 hour(s))  Protein  / creatinine ratio, urine     Status: Abnormal   Collection Time: 09/07/21 12:16 PM  Result Value Ref Range   Creatinine, Urine 75 mg/dL   Total Protein, Urine 21 mg/dL   Protein Creatinine Ratio 0.28 (H) 0.00 - 0.15 mg/mg[Cre]  Urine Drug Screen, Qualitative ([facility] only)     Status: None   Collection Time: 09/07/21 12:16 PM  Result Value Ref Range   Tricyclic, Ur Screen NONE DETECTED NONE DETECTED   Amphetamines, Ur Screen NONE DETECTED NONE DETECTED   MDMA (Ecstasy)Ur Screen NONE DETECTED NONE DETECTED   Cocaine Metabolite,Ur [place] NONE DETECTED NONE DETECTED   Opiate, Ur Screen NONE DETECTED NONE DETECTED   Phencyclidine (PCP) Ur S NONE DETECTED NONE DETECTED   Cannabinoid 50 Ng, Ur [place] NONE DETECTED NONE DETECTED   Barbiturates, Ur Screen NONE DETECTED NONE DETECTED   Benzodiazepine, Ur Scrn NONE DETECTED NONE DETECTED   Methadone Scn, Ur NONE DETECTED NONE DETECTED  CBC     Status: Abnormal   Collection Time: 09/07/21 12:32 PM  Result Value Ref Range   WBC 10.6 (H) 4.0 - 10.5 K/uL   RBC 3.78 (L) 3.87 - 5.11 MIL/uL   Hemoglobin 11.0 (L) 12.0 - 15.0 g/dL   HCT 11/07/21 (L) 81.8 - 29.9 %   MCV 89.9 80.0 - 100.0 fL   MCH 29.1 26.0 - 34.0 pg   MCHC 32.4 30.0 - 36.0 g/dL  RDW 13.7 11.5 - 15.5 %   Platelets 275 150 - 400 K/uL   nRBC 0.0 0.0 - 0.2 %  Type and screen [facility]     Status: None (Preliminary result)   Collection Time: 09/07/21 12:32 PM  Result Value Ref Range   ABO/RH(D) PENDING    Antibody Screen PENDING    Sample Expiration      09/10/2021,2359 Performed at [facility], [address] [place], [place] [address]   Comprehensive metabolic panel     Status: Abnormal   Collection Time: 09/07/21 12:32 PM  Result Value Ref Range   Sodium 138 135 - 145 mmol/L   Potassium 3.9 3.5 - 5.1 mmol/L   Chloride 105 98 - 111 mmol/L   CO2 23 22 - 32 mmol/L   Glucose, Bld 94 70 - 99 mg/dL   BUN 7 6 - 20 mg/dL   Creatinine, Ser 6.04 0.44 - 1.00  mg/dL   Calcium 9.7 8.9 - 54.0 mg/dL   Total Protein 8.1 6.5 - 8.1 g/dL   Albumin 3.0 (L) 3.5 - 5.0 g/dL   AST 54 (H) 15 - 41 U/L   ALT 130 (H) 0 - 44 U/L   Alkaline Phosphatase 238 (H) 38 - 126 U/L   Total Bilirubin 1.3 (H) 0.3 - 1.2 mg/dL   GFR, Estimated >98 >11 mL/min   Anion gap 10 5 - 15  Rapid HIV screen (HIV 1/2 Ab+Ag)     Status: None   Collection Time: 09/07/21 12:32 PM  Result Value Ref Range   HIV-1 P24 Antigen - HIV24 NON REACTIVE NON REACTIVE   HIV 1/2 Antibodies NON REACTIVE NON REACTIVE   Interpretation (HIV Ag Ab)      A non reactive test result means that HIV 1 or HIV 2 antibodies and HIV 1 p24 antigen were not detected in the specimen.    Pertinent Results:  Prenatal Labs: Blood type/Rh O Pos  Antibody screen   Rubella   Varicella   RPR   HBsAg   HIV NR  GC   Chlamydia   Genetic screening   1 hour GTT   3 hour GTT   GBS  Pending by PCR  Prenatal labs pending.   FHT: Cat I tracing; 125bpm, mod variability, + accels, no decels TOCO: q1-66min SVE: 8-9/C/+1   Cephalic by leopolds/SVE  No results found.  Assessment:  Rebecca Zimmerman is a 21 y.o. G57P1001 female at [redacted]w[redacted]d with no PNC, elevated BP without dx HTN, active labor.   Plan:  1. Admit to Labor & Delivery; consents reviewed and obtained  2. Fetal Well being  - Fetal Tracing: Cat I - Group B Streptococcus ppx indicated: pending by PCR, term pregnancy without risk factors.  - Presentation: cephalic confirmed by exam   3. Routine OB: - Prenatal labs reviewed, as above - Rh O Pos - CBC, T&S, RPR on admit - Clear fluids, IVF  4. Monitoring of Labor -  Contractions: external toco in place -  Pelvis proven to 5#14 -  Plan for continuous fetal monitoring  - Anticipate vaginal delivery- see delivery note, pt delivered prior to H&P completion.   5. Post Partum Planning: - Infant feeding: formula - Contraception: TBD  Rebecca Zimmerman, CNM 09/07/21 1:18 PM

## 2021-09-07 NOTE — ED Triage Notes (Signed)
Pt here with reports that she had a positive preg test on Sept 29, 2 weeks after that she began having vaginal bleeding and seen her OB, had [place] and was told that she was having a miscarriage. Pt reports that she has been having intermittent monthly bleeding since sept. Yesterday pt began having abd pain. Today pain has worsened and increased in frequency. EDP called to triage and [place] completed and FHR was 138.

## 2021-09-07 NOTE — Discharge Summary (Signed)
Obstetrical Discharge Summary  Patient Name: GEM CONKLE DOB: 2000-10-16 MRN: [id number]  Date of Admission: 09/07/2021 Date of Delivery: 09/07/21 Delivered by: Dala Dock CNM Date of Discharge: 09/08/2021  Primary OB: [facility] for dating [place] only  DUK:GURKYHC'W last menstrual period was 01/04/2021 (approximate). EDC Estimated Date of Delivery: 09/11/21 Gestational Age at Delivery: [redacted]w[redacted]d   Antepartum complications:  1. NO prenatal care this preg- dating US done 01/2021 2. Obesity 3. Prior hx cholestasis 05/2020, closely spaced pregnancies.   Admitting Diagnosis: abdominal pain, [redacted]wks gestation Secondary Diagnosis: SVD, meconium stained fluid Patient Active Problem List   Diagnosis Date Noted   Anxiety about health 08/24/2021    Augmentation: N/A Complications: None Intrapartum complications/course: see delivery note.  Date of Delivery: 09/07/21 Delivered By: Dala Dock CNM Delivery Type: spontaneous vaginal delivery Anesthesia: epidural Placenta: spontaneous Laceration: none Episiotomy: none Newborn Data: Live born female  Birth Weight: 6 lb 13.4 oz (3100 g) APGAR: 8, 9  Newborn Delivery   Birth date/time: 09/07/2021 13:25:00 Delivery type: Vaginal, Spontaneous     Postpartum Procedures: Iron transfusion offered prior to discharge.  IV infiltrated prior to administration and [facility] declined restarting IV.  Will take PO iron supplements.   Edinburgh:     09/07/2021    6:10 PM 05/27/2020   11:21 PM  Edinburgh Postnatal Depression Scale Screening Tool  I have been able to laugh and see the funny side of things. 0 0  I have looked forward with enjoyment to things. 0 0  I have blamed myself unnecessarily when things went wrong. 0 0  I have been anxious or worried for no good reason. 0 0  I have felt scared or panicky for no good reason. 0 0  Things have been getting on top of me. 0 0  I have been so unhappy that I have had difficulty sleeping. 0 0  I have felt sad  or miserable. 0 1  I have been so unhappy that I have been crying. 0 1  The thought of harming myself has occurred to me. 0 0  Edinburgh Postnatal Depression Scale Total 0 2      Post partum course:  Patient had an uncomplicated postpartum course.  By time of discharge on PPD#1, her pain was controlled on oral pain medications; she had appropriate lochia and was ambulating, voiding without difficulty and tolerating regular diet.  Blood pressure was controlled with oral antihypertensives.  Instructed to continue PO iron supplements. She was deemed stable for discharge to home.     Discharge Physical Exam:  BP 137/90 (BP Location: Left Arm)   Pulse 91   Temp 98.7 F (37.1 C) (Oral)   Resp 20   Ht 5\' 7"  (1.702 m)   Wt 98.4 kg   LMP 01/04/2021 (Approximate)   SpO2 96%   Breastfeeding Unknown   BMI 33.99 kg/m   General: NAD CV: RRR Pulm: CTABL, nl effort ABD: s/nd/nt, fundus firm and below the umbilicus Lochia: moderate Perineum: well approximated/intact DVT Evaluation: LE non-ttp, no evidence of DVT on exam.  Hemoglobin  Date Value Ref Range Status  09/08/2021 9.3 (L) 12.0 - 15.0 g/dL Final  11/08/2021 23/76/2831 11.1 - 15.9 g/dL Final   HCT  Date Value Ref Range Status  09/08/2021 28.2 (L) 36.0 - 46.0 % Final   Hematocrit  Date Value Ref Range Status  05/18/2015 35.3 34.0 - 46.6 % Final     Disposition: stable, discharge to home. Baby Feeding: formula Baby Disposition: home with  mom  Rh Immune globulin given: n/a Rubella vaccine given: Immune Varicella vaccine given: Varivax vaccine documented 2003 and 2007 Tdap vaccine given in AP or PP setting: Offered prior to discharge Flu vaccine given in AP or PP setting: due in season  Contraception: Liletta IUD  Prenatal Labs:  Blood type/Rh O Pos  Antibody screen neg  Rubella Immune  Varicella Result pending. Varivax vaccine documented 2003 and 2007   RPR NR  HBsAg NR  HIV NR  GC  neg  Chlamydia  neg  Genetic  screening  no PNC  1 hour GTT  no PNC  3 hour GTT  N/A  GBS  Neg by PCR     Plan:  JOVANKA WESTGATE was discharged to home in good condition. Follow-up appointment with [facility] in 5 days for blood pressure check and with delivering provider in 6 weeks.  Discharge Medications: Allergies as of 09/08/2021   No Known Allergies      Medication List     TAKE these medications    acetaminophen 325 MG tablet Commonly known as: Tylenol Take 2 tablets (650 mg total) by mouth every 4 (four) hours as needed (for pain scale < 4).   ferrous sulfate 325 (65 FE) MG tablet Take 1 tablet (325 mg total) by mouth 2 (two) times daily with a meal.   ibuprofen 600 MG tablet Commonly known as: ADVIL Take 1 tablet (600 mg total) by mouth every 6 (six) hours as needed.   NIFEdipine 30 MG 24 hr tablet Commonly known as: ADALAT CC Take 1 tablet (30 mg total) by mouth daily. Start taking on: September 09, 2021         Follow-up Information     [facility]. Schedule an appointment as soon as possible for a visit in 5 day(s).   Why: blood pressure check Contact information: [address] [place] [address] [phone]        Juana Haralson, Prudencio Pair, CNM. Schedule an appointment as soon as possible for a visit in 6 week(s).   Specialty: Obstetrics and Gynecology Why: postpartum visit and IUD placement Contact information: [address] [place] [address] [phone]                 Signed:  Gustavo Lah, CNM 09/08/2021  1:20 PM  Margaretmary Eddy, CNM Certified Nurse Midwife [facility]

## 2021-09-08 DIAGNOSIS — Z8719 Personal history of other diseases of the digestive system: Secondary | ICD-10-CM

## 2021-09-08 DIAGNOSIS — O139 Gestational [pregnancy-induced] hypertension without significant proteinuria, unspecified trimester: Secondary | ICD-10-CM | POA: Diagnosis present

## 2021-09-08 DIAGNOSIS — O0933 Supervision of pregnancy with insufficient antenatal care, third trimester: Secondary | ICD-10-CM

## 2021-09-08 LAB — CBC
HCT: 28.2 % — ABNORMAL LOW (ref 36.0–46.0)
Hemoglobin: 9.3 g/dL — ABNORMAL LOW (ref 12.0–15.0)
MCH: 29.8 pg (ref 26.0–34.0)
MCHC: 33 g/dL (ref 30.0–36.0)
MCV: 90.4 fL (ref 80.0–100.0)
Platelets: 206 10*3/uL (ref 150–400)
RBC: 3.12 MIL/uL — ABNORMAL LOW (ref 3.87–5.11)
RDW: 13.6 % (ref 11.5–15.5)
WBC: 10.4 10*3/uL (ref 4.0–10.5)
nRBC: 0 % (ref 0.0–0.2)

## 2021-09-08 LAB — COMPREHENSIVE METABOLIC PANEL
ALT: 89 U/L — ABNORMAL HIGH (ref 0–44)
AST: 39 U/L (ref 15–41)
Albumin: 2.5 g/dL — ABNORMAL LOW (ref 3.5–5.0)
Alkaline Phosphatase: 200 U/L — ABNORMAL HIGH (ref 38–126)
Anion gap: 9 (ref 5–15)
BUN: 9 mg/dL (ref 6–20)
CO2: 24 mmol/L (ref 22–32)
Calcium: 8.9 mg/dL (ref 8.9–10.3)
Chloride: 103 mmol/L (ref 98–111)
Creatinine, Ser: 0.46 mg/dL (ref 0.44–1.00)
GFR, Estimated: >60 mL/min (ref >60)
Glucose, Bld: 89 mg/dL (ref 70–99)
Potassium: 4.1 mmol/L (ref 3.5–5.1)
Sodium: 136 mmol/L (ref 135–145)
Total Bilirubin: 0.9 mg/dL (ref 0.3–1.2)
Total Protein: 6.6 g/dL (ref 6.5–8.1)

## 2021-09-08 LAB — RPR: RPR Ser Ql: NONREACTIVE

## 2021-09-08 LAB — VARICELLA ZOSTER ANTIBODY, IGG: Varicella IgG: 187 index (ref >165)

## 2021-09-08 LAB — RUBELLA SCREEN: Rubella: 3.52 index (ref >0.99)

## 2021-09-08 LAB — HEPATITIS B SURFACE ANTIGEN: Hepatitis B Surface Ag: NONREACTIVE

## 2021-09-08 MED ORDER — FERROUS SULFATE 325 (65 FE) MG PO TABS
325.0000 mg | ORAL_TABLET | Freq: Two times a day (BID) | ORAL | 1 refills | Status: DC
Start: 2021-09-08 — End: 2022-03-22

## 2021-09-08 MED ORDER — SODIUM CHLORIDE 0.9 % IV SOLN
200.0000 mg | Freq: Once | INTRAVENOUS | Status: DC
  Filled 2021-09-08: qty 10

## 2021-09-08 MED ORDER — ACETAMINOPHEN 325 MG PO TABS: 650.0000 mg | ORAL_TABLET | ORAL | Status: DC | PRN

## 2021-09-08 MED ORDER — NIFEDIPINE ER 30 MG PO TB24: 30.0000 mg | ORAL_TABLET | Freq: Every day | ORAL | 1 refills | Status: DC

## 2021-09-08 MED ORDER — NIFEDIPINE ER OSMOTIC RELEASE 30 MG PO TB24
30.0000 mg | ORAL_TABLET | Freq: Every day | ORAL | Status: DC
  Administered 2021-09-08: 30 mg via ORAL
  Filled 2021-09-08: qty 1

## 2021-09-08 MED ORDER — IBUPROFEN 600 MG PO TABS
600.0000 mg | ORAL_TABLET | Freq: Four times a day (QID) | ORAL | 0 refills | Status: DC | PRN
Start: 2021-09-08 — End: 2022-03-22

## 2021-09-08 NOTE — Progress Notes (Signed)
Postpartum Day  1  Subjective: no complaints, up ad lib, voiding, and tolerating PO  Doing well, no concerns. Ambulating without difficulty, pain managed with PO meds, tolerating regular diet, and voiding without difficulty.   No fever/chills, chest pain, shortness of breath, nausea/vomiting, or leg pain. No nipple or breast pain. No headache, visual changes, or RUQ/epigastric pain.  Objective: BP 130/90 (BP Location: Left Arm)   Pulse 85   Temp 97.9 F (36.6 C) (Oral)   Resp 18   Ht 5\' 7"  (1.702 m)   Wt 98.4 kg   LMP 01/04/2021 (Approximate)   SpO2 95%   Breastfeeding Unknown   BMI 33.99 kg/m    Vitals:   09/07/21 1400 09/07/21 1416 09/07/21 1431 09/07/21 1446  BP: 138/86 (!) 150/76 (!) 146/86 125/82   09/07/21 1502 09/07/21 1531 09/07/21 1618 09/07/21 1725  BP: (!) 142/84 136/89 135/86 130/88   09/07/21 1925 09/07/21 2328 09/08/21 0345 09/08/21 0802  BP: 124/78 133/86 121/83 130/90     Physical Exam:  General: alert, cooperative, and no distress Breasts: soft/nontender CV: RRR Pulm: nl effort, CTABL Abdomen: soft, non-tender, active bowel sounds Uterine Fundus: firm Perineum: minimal edema, intact Lochia: appropriate DVT Evaluation: No evidence of DVT seen on physical exam.  Recent Labs    09/07/21 1232 09/08/21 0701  HGB 11.0* 9.3*  HCT 34.0* 28.2*  WBC 10.6* 10.4  PLT 275 206    Assessment/Plan: 21 y.o. 11/08/21 postpartum day # 1  -Continue routine postpartum care -Encouraged snug fitting bra, cold application, Tylenol PRN, and cabbage leaves for engorgement for formula feeding  -Discussed contraceptive options including implant, IUDs hormonal and non-hormonal, injection, pills/ring/patch, condoms, and NFP. Desires Liletta IUD. -Acute blood loss anemia - hemodynamically stable and asymptomatic; start PO ferrous sulfate BID with stool softeners  -Immunization status:   all immunizations up to date, varicella titer pending. -Several mild range BP's  noted since delivery.  LFT's improved. Asymptomatic, denies HA, visual changes, or RUQ pain.  Will start on procardia today.    Disposition: Continue inpatient postpartum care. Desires discharge home today   LOS: 1 day   [id number], Gustavo Lah 09/08/2021, 9:29 AM   ----- 11/08/2021  Certified Nurse Midwife [facility]

## 2021-09-08 NOTE — Progress Notes (Signed)
Patient discharged with infant. Discharge instructions, prescriptions, and follow up appointments given to and reviewed with patient. Patient verbalized understanding. Escorted out by staff. 

## 2021-09-08 NOTE — Discharge Instructions (Signed)
Vaginal Delivery, Care After Refer to this sheet in the next few weeks. These discharge instructions provide you with information on caring for yourself after delivery. Your caregiver may also give you specific instructions. Your treatment has been planned according to the most current medical practices available, but problems sometimes occur. Call your caregiver if you have any problems or questions after you go home. HOME CARE INSTRUCTIONS Take over-the-counter or prescription medicines only as directed by your caregiver or pharmacist. Do not drink alcohol, especially if you are breastfeeding or taking medicine to relieve pain. Do not smoke tobacco. Continue to use good perineal care. Good perineal care includes: Wiping your perineum from back to front Keeping your perineum clean. You can do sitz baths twice a day, to help keep this area clean Do not use tampons, douche or have sex until your caregiver says it is okay. Shower only and avoid sitting in submerged water, aside from sitz baths Wear a well-fitting bra that provides breast support. Eat healthy foods. Drink enough fluids to keep your urine clear or pale yellow. Eat high-fiber foods such as whole grain cereals and breads, brown rice, beans, and fresh fruits and vegetables every day. These foods may help prevent or relieve constipation. Avoid constipation with high fiber foods or medications, such as miralax or metamucil Follow your caregiver's recommendations regarding resumption of activities such as climbing stairs, driving, lifting, exercising, or traveling. Talk to your caregiver about resuming sexual activities. Resumption of sexual activities is dependent upon your risk of infection, your rate of healing, and your comfort and desire to resume sexual activity. Try to have someone help you with your household activities and your newborn for at least a few days after you leave the hospital. Rest as much as possible. Try to rest or  take a nap when your newborn is sleeping. Increase your activities gradually. Keep all of your scheduled postpartum appointments. It is very important to keep your scheduled follow-up appointments. At these appointments, your caregiver will be checking to make sure that you are healing physically and emotionally. SEEK MEDICAL CARE IF:  You are passing large clots from your vagina. Save any clots to show your caregiver. You have a foul smelling discharge from your vagina. You have trouble urinating. You are urinating frequently. You have pain when you urinate. You have a change in your bowel movements. You have increasing redness, pain, or swelling near your vaginal incision (episiotomy) or vaginal tear. You have pus draining from your episiotomy or vaginal tear. Your episiotomy or vaginal tear is separating. You have painful, hard, or reddened breasts. You have a severe headache. You have blurred vision or see spots. You feel sad or depressed. You have thoughts of hurting yourself or your newborn. You have questions about your care, the care of your newborn, or medicines. You are dizzy or light-headed. You have a rash. You have nausea or vomiting. You were breastfeeding and have not had a menstrual period within 12 weeks after you stopped breastfeeding. You are not breastfeeding and have not had a menstrual period by the 12th week after delivery. You have a fever. SEEK IMMEDIATE MEDICAL CARE IF:  You have persistent pain. You have chest pain. You have shortness of breath. You faint. You have leg pain. You have stomach pain. Your vaginal bleeding saturates two or more sanitary pads in 1 hour. MAKE SURE YOU:  Understand these instructions. Will watch your condition. Will get help right away if you are not doing well or   get worse. Document Released: 03/22/2000 Document Revised: 08/09/2013 Document Reviewed: 11/20/2011 ExitCare Patient Information 2015 [employer]. This  information is not intended to replace advice given to you by your health care provider. Make sure you discuss any questions you have with your health care provider.  Sitz Bath A sitz bath is a warm water bath taken in the sitting position. The water covers only the hips and butt (buttocks). We recommend using one that fits in the toilet, to help with ease of use and cleanliness. It may be used for either healing or cleaning purposes. Sitz baths are also used to relieve pain, itching, or muscle tightening (spasms). The water may contain medicine. Moist heat will help you heal and relax.  HOME CARE  Take 3 to 4 sitz baths a day. Fill the bathtub half-full with warm water. Sit in the water and open the drain a little. Turn on the warm water to keep the tub half-full. Keep the water running constantly. Soak in the water for 15 to 20 minutes. After the sitz bath, pat the affected area dry. GET HELP RIGHT AWAY IF: You get worse instead of better. Stop the sitz baths if you get worse. MAKE SURE YOU: Understand these instructions. Will watch your condition. Will get help right away if you are not doing well or get worse. Document Released: 05/02/2004 Document Revised: 12/18/2011 Document Reviewed: 07/23/2010 ExitCare Patient Information 2015 [employer]. This information is not intended to replace advice given to you by your health care provider. Make sure you discuss any questions you have with your health care provider.   

## 2021-09-08 NOTE — TOC Initial Note (Signed)
Transition of Care [facility]) - Initial/Assessment Note    Patient Details  Name: Rebecca Zimmerman MRN: [id number] Date of Birth: 20-Jun-2000  [facility]) CM/SW Contact:    Merrily Brittle, LCSWA Phone Number: 09/08/2021, 11:50 AM  Clinical Narrative:                    TOC consult due to no prenatal care. CSW spoke to patient and FOB at bedside. Patient reported not knowing she was pregnant until she was in labor at the hospital. Patient reported having baby items from previous child but still in need of additional diapers and formula. CSW provided diaper bank information and recommended WIC. The patient reported plans to enroll newborn in [facility] and arrange pediatrician appointment. Patient acknowledged understanding of PPD symptoms.   No further TOC needs at this time.     Patient Goals and CMS Choice        Expected Discharge Plan and Services                                                Prior Living Arrangements/Services                       Activities of Daily Living Home Assistive Devices/Equipment: None ADL Screening (condition at time of admission) Patient's cognitive ability adequate to safely complete daily activities?: Yes Is the patient deaf or have difficulty hearing?: No Does the patient have difficulty seeing, even when wearing glasses/contacts?: No Does the patient have difficulty concentrating, remembering, or making decisions?: No Patient able to express need for assistance with ADLs?: Yes Does the patient have difficulty dressing or bathing?: No Independently performs ADLs?: Yes (appropriate for developmental age) Does the patient have difficulty walking or climbing stairs?: No Weakness of Legs: None Weakness of Arms/Hands: None  Permission Sought/Granted                  Emotional Assessment              Admission diagnosis:  No prenatal care in current pregnancy in third trimester [O09.33] Patient Active  Problem List   Diagnosis Date Noted   Anxiety about health 08/24/2021   PCP:  [name], FNP Pharmacy:   [facility] [address], [place] - [address] [place] [address] Phone: [phone] Fax: [phone]  [id number] - [place], [place] - [address] [place] [address] Phone: [phone] Fax: [phone]  [id number] - [place], [place] - [address] [place] [address] Phone: [phone] Fax: [phone]  [id number] - [place], [place] - [address] AT [facility] [address] [place] [address] Phone: [phone] Fax: [phone]  [id number] [place], [place] - [address] AT [facility] [address] [place] [address] Phone: [phone] Fax: [phone]     Social Determinants of Health (SDOH) Interventions    Readmission Risk Interventions     View : No data to display.

## 2021-09-12 DIAGNOSIS — Z0130 Encounter for examination of blood pressure without abnormal findings: Secondary | ICD-10-CM | POA: Diagnosis not present

## 2021-09-16 ENCOUNTER — Emergency Department
Admission: EM | Admit: 2021-09-16 | Discharge: 2021-09-16 | Disposition: A | Discharge status: Home / Self Care | Payer: Medicaid Other | Attending: Emergency Medicine | Admitting: Emergency Medicine

## 2021-09-16 DIAGNOSIS — Z3A39 39 weeks gestation of pregnancy: Secondary | ICD-10-CM | POA: Diagnosis not present

## 2021-09-16 DIAGNOSIS — O165 Unspecified maternal hypertension, complicating the puerperium: Secondary | ICD-10-CM | POA: Diagnosis not present

## 2021-09-16 DIAGNOSIS — O9081 Anemia of the puerperium: Secondary | ICD-10-CM | POA: Diagnosis not present

## 2021-09-16 DIAGNOSIS — I10 Essential (primary) hypertension: Secondary | ICD-10-CM

## 2021-09-16 DIAGNOSIS — O1493 Unspecified pre-eclampsia, third trimester: Secondary | ICD-10-CM | POA: Diagnosis not present

## 2021-09-16 DIAGNOSIS — R7401 Elevation of levels of liver transaminase levels: Secondary | ICD-10-CM | POA: Insufficient documentation

## 2021-09-16 DIAGNOSIS — O1495 Unspecified pre-eclampsia, complicating the puerperium: Secondary | ICD-10-CM | POA: Diagnosis not present

## 2021-09-16 LAB — CBC WITH DIFFERENTIAL/PLATELET
Abs Immature Granulocytes: 0.08 10*3/uL — ABNORMAL HIGH (ref 0.00–0.07)
Basophils Absolute: 0 10*3/uL (ref 0.0–0.1)
Basophils Relative: 0 %
Eosinophils Absolute: 0.1 10*3/uL (ref 0.0–0.5)
Eosinophils Relative: 1 %
HCT: 34 % — ABNORMAL LOW (ref 36.0–46.0)
Hemoglobin: 10.7 g/dL — ABNORMAL LOW (ref 12.0–15.0)
Immature Granulocytes: 1 %
Lymphocytes Relative: 22 %
Lymphs Abs: 1.6 10*3/uL (ref 0.7–4.0)
MCH: 28.9 pg (ref 26.0–34.0)
MCHC: 31.5 g/dL (ref 30.0–36.0)
MCV: 91.9 fL (ref 80.0–100.0)
Monocytes Absolute: 0.4 10*3/uL (ref 0.1–1.0)
Monocytes Relative: 5 %
Neutro Abs: 5.2 10*3/uL (ref 1.7–7.7)
Neutrophils Relative %: 71 %
Platelets: 304 10*3/uL (ref 150–400)
RBC: 3.7 MIL/uL — ABNORMAL LOW (ref 3.87–5.11)
RDW: 13.4 % (ref 11.5–15.5)
WBC: 7.3 10*3/uL (ref 4.0–10.5)
nRBC: 0 % (ref 0.0–0.2)

## 2021-09-16 LAB — URINALYSIS, ROUTINE W REFLEX MICROSCOPIC
Bilirubin Urine: NEGATIVE
Glucose, UA: NEGATIVE mg/dL
Ketones, ur: NEGATIVE mg/dL
Nitrite: NEGATIVE
Protein, ur: NEGATIVE mg/dL
Specific Gravity, Urine: 1.006 (ref 1.005–1.030)
pH: 6 (ref 5.0–8.0)

## 2021-09-16 LAB — COMPREHENSIVE METABOLIC PANEL
ALT: 32 U/L (ref 0–44)
AST: 21 U/L (ref 15–41)
Albumin: 3.7 g/dL (ref 3.5–5.0)
Alkaline Phosphatase: 149 U/L — ABNORMAL HIGH (ref 38–126)
Anion gap: 10 (ref 5–15)
BUN: 12 mg/dL (ref 6–20)
CO2: 24 mmol/L (ref 22–32)
Calcium: 9.3 mg/dL (ref 8.9–10.3)
Chloride: 103 mmol/L (ref 98–111)
Creatinine, Ser: 0.53 mg/dL (ref 0.44–1.00)
GFR, Estimated: >60 mL/min (ref >60)
Glucose, Bld: 98 mg/dL (ref 70–99)
Potassium: 3.7 mmol/L (ref 3.5–5.1)
Sodium: 137 mmol/L (ref 135–145)
Total Bilirubin: 0.7 mg/dL (ref 0.3–1.2)
Total Protein: 7.8 g/dL (ref 6.5–8.1)

## 2021-09-16 LAB — PROTEIN / CREATININE RATIO, URINE
Creatinine, Urine: 48 mg/dL
Total Protein, Urine: <6 mg/dL

## 2021-09-16 LAB — LIPASE, BLOOD: Lipase: 38 U/L (ref 11–51)

## 2021-09-16 MED ORDER — LABETALOL HCL 5 MG/ML IV SOLN
5.0000 mg | Freq: Once | INTRAVENOUS | Status: AC
  Administered 2021-09-16: 5 mg via INTRAVENOUS
  Filled 2021-09-16: qty 4

## 2021-09-16 MED ORDER — DIPHENHYDRAMINE HCL 50 MG/ML IJ SOLN
12.5000 mg | Freq: Once | INTRAMUSCULAR | Status: AC
  Administered 2021-09-16: 12.5 mg via INTRAVENOUS
  Filled 2021-09-16: qty 1

## 2021-09-16 MED ORDER — PROCHLORPERAZINE EDISYLATE 10 MG/2ML IJ SOLN
10.0000 mg | Freq: Once | INTRAMUSCULAR | Status: AC
  Administered 2021-09-16: 10 mg via INTRAVENOUS
  Filled 2021-09-16: qty 2

## 2021-09-16 MED ORDER — LABETALOL HCL 5 MG/ML IV SOLN: 10.0000 mg | Freq: Once | INTRAVENOUS | Status: DC

## 2021-09-16 MED ORDER — ACETAMINOPHEN 500 MG PO TABS
1000.0000 mg | ORAL_TABLET | Freq: Once | ORAL | Status: AC
  Administered 2021-09-16: 1000 mg via ORAL
  Filled 2021-09-16: qty 2

## 2021-09-16 NOTE — Discharge Instructions (Addendum)
Have a recheck of your blood pressure tomorrow return to the ER if you develop vision changes, worsening headache or any other concern

## 2021-09-16 NOTE — ED Notes (Signed)
DC ppw provided, Pt provides verbal consent for dc at this time. PT alert and oriented to lobby on foot.

## 2021-09-16 NOTE — ED Provider Notes (Signed)
[facility] Provider Note    None    (approximate)   History   Hypertension   HPI  Rebecca Zimmerman is a 21 y.o. female with history of no prenatal care during pregnancy who was admitted on 6/2 for delivery at 39 weeks with concern for preeclampsia who comes in with concerns for elevated blood pressure.  Patient reports having high blood pressures today and took nifedipine x2.  She had follow-up on 6/7 and her blood pressure was 128/85.  Patient reports that she was just started on the nifedipine after birth due to concerns for preeclampsia.  She reports that she has been taking her medications as prescribed but yesterday she started to have a little bit of a headache and today she did this the headache and her blood pressure was elevated therefore she came to the hospital.  She denies any blurred vision, no shortness of breath, leg swelling.   I reviewed the admission summary from 09/07/2021   Physical Exam   Triage Vital Signs: ED Triage Vitals  Enc Vitals Group     BP 09/16/21 1046 (!) 139/99     Pulse Rate 09/16/21 1047 72     Resp 09/16/21 1047 18     Temp 09/16/21 1047 98.6 F (37 C)     Temp Source 09/16/21 1047 Oral     SpO2 09/16/21 1047 96 %     Weight --      Height --      Head Circumference --      Peak Flow --      Pain Score 09/16/21 1046 0     Pain Loc --      Pain Edu? --      Excl. in [place]? --     Most recent vital signs: Vitals:   09/16/21 1046 09/16/21 1047  BP: (!) 139/99   Pulse:  72  Resp:  18  Temp:  98.6 F (37 C)  SpO2:  96%     General: Awake, no distress.  CV:  Good peripheral perfusion.  Resp:  Normal effort.  Abd:  No distention.  Other:  CN nerves II to XII are intact.  Equal strength in arms and legs-- pupils are equal and reactive.  Extraocular movements are intact    ED Results / Procedures / Treatments   Labs (all labs ordered are listed, but only abnormal results are displayed) Labs Reviewed   CBC WITH DIFFERENTIAL/PLATELET - Abnormal; Notable for the following components:      Result Value   RBC 3.70 (*)    Hemoglobin 10.7 (*)    HCT 34.0 (*)    Abs Immature Granulocytes 0.08 (*)    All other components within normal limits  COMPREHENSIVE METABOLIC PANEL - Abnormal; Notable for the following components:   Alkaline Phosphatase 149 (*)    All other components within normal limits  LIPASE, BLOOD  PROTEIN / CREATININE RATIO, URINE  URINALYSIS, ROUTINE W REFLEX MICROSCOPIC     EKG  My interpretation of EKG:  Normal sinus rate of 90, no ST elevation, no T wave versions, normal intervals  RADIOLOGY None   PROCEDURES:  Critical Care performed: No  Procedures   MEDICATIONS ORDERED IN ED: Medications  labetalol (NORMODYNE) injection 5 mg (5 mg Intravenous Given 09/16/21 1145)  diphenhydrAMINE (BENADRYL) injection 12.5 mg (12.5 mg Intravenous Given 09/16/21 1114)  acetaminophen (TYLENOL) tablet 1,000 mg (1,000 mg Oral Given 09/16/21 1114)  prochlorperazine (COMPAZINE) injection 10 mg (  10 mg Intravenous Given 09/16/21 1114)     IMPRESSION / MDM / ASSESSMENT AND PLAN / ED COURSE  I reviewed the triage vital signs and the nursing notes.   Patient's presentation is most consistent with acute presentation with potential threat to life or bodily function.   This concerning for preeclampsia.  No evidence at this time of severe preeclampsia but will get labs to further evaluate.  Discussed the case with Dr. Glennon Mac from OB/GYN to see if patient can go upstairs for this work-up given this is all pregnancy related but given she is postpartum they asked that we start her down here given there is a chance that she could just be discharged from our emergency room.  Patient was given a migraine cocktail as well as some labetalol to help with her blood pressure.  I do not feel like this represents cavernous venous thrombus or intracranial hemorrhage given neuro exam is otherwise  well-appearing and headache is only very mild.   12:32 PM reevaluated patient she is resting in bed.  Blood pressure 125/89 and her headache is now relieved  Lipase is normal.  CBC shows stable hemoglobin of 10.7.  CMP shows slightly elevated alk phos but she has had this previously 1 year ago so I do not feel like this is related to preeclampsia  1:17 PM  Reevaluated patient she continues to not have any headache.  Discussed with Dr. Glennon Mac and going to leave medications at the 60 mg nifedipine and have her follow-up for recheck of blood pressure tomorrow.  Patient feels comfortable with this plan and will be discharged home  The patient is on the cardiac monitor to evaluate for evidence of arrhythmia and/or significant heart rate changes.      FINAL CLINICAL IMPRESSION(S) / ED DIAGNOSES   Final diagnoses:  Hypertension, unspecified type  Pre-eclampsia in postpartum period     Rx / DC Orders   ED Discharge Orders     None        Note:  This document was prepared using Dragon voice recognition software and may include unintentional dictation errors.   Vanessa [place], MD 09/16/21 1319

## 2021-09-16 NOTE — ED Triage Notes (Signed)
Pt comes 9 days postpartum and was sent by OB for HTN. Bottom number high at home 129/101. 150/102 here. Hx of HTN before pregnancy.   Spoke with OB upstairs who states that we have to see her down here first to be "determined if it's pregnancy related" before they will see her upstairs.

## 2021-09-16 NOTE — ED Notes (Addendum)
Pt took x2 of her nifedepine today as verbally prescribed by her OB

## 2021-10-22 DIAGNOSIS — Z113 Encounter for screening for infections with a predominantly sexual mode of transmission: Secondary | ICD-10-CM | POA: Diagnosis not present

## 2021-10-22 DIAGNOSIS — Z30430 Encounter for insertion of intrauterine contraceptive device: Secondary | ICD-10-CM | POA: Diagnosis not present

## 2021-10-22 DIAGNOSIS — Z3202 Encounter for pregnancy test, result negative: Secondary | ICD-10-CM | POA: Diagnosis not present

## 2021-11-29 DIAGNOSIS — Z30431 Encounter for routine checking of intrauterine contraceptive device: Secondary | ICD-10-CM | POA: Diagnosis not present

## 2022-03-19 ENCOUNTER — Ambulatory Visit: Payer: Self-pay

## 2022-03-19 NOTE — Telephone Encounter (Signed)
  Chief Complaint: left hand  Symptoms: pain and numbness Frequency: 1 month Pertinent Negatives: Patient denies swelling, neck pain, fever Disposition: [] ED /[] Urgent Care (no appt availability in office) / [x] Appointment(In office/virtual)/ []  [facility]/ [] Home Care/ [] Refused Recommended Disposition /[] [facility]/ []  Follow-up with PCP Additional Notes: Reason for Disposition  Hand or wrist pain is a chronic symptom (recurrent or ongoing AND present > 4 weeks)  Answer Assessment - Initial Assessment Questions 1. ONSET: "When did the pain start?"     1 month  2. LOCATION: "Where is the pain located?"     Thumb, numbness 3. PAIN: "How bad is the pain?" (Scale 1-10; or mild, moderate, severe)   - MILD (1-3): doesn't interfere with normal activities   - MODERATE (4-7): interferes with normal activities (e.g., work or school) or awakens from sleep   - SEVERE (8-10): excruciating pain, unable to use hand at all     Mild dropping  4. WORK OR EXERCISE: "Has there been any recent work or exercise that involved this part (i.e., hand or wrist) of the body?"     No- non dominant hand 5. CAUSE: "What do you think is causing the pain?"     ? Carpal tunnel,  6. AGGRAVATING FACTORS: "What makes the pain worse?" (e.g., using computer)     Moving your thumb numbness 7. OTHER SYMPTOMS: "Do you have any other symptoms?" (e.g., neck pain, swelling, rash, numbness, fever)     numbness 8. PREGNANCY: "Is there any chance you are pregnant?" "When was your last menstrual period?"     *No Answer*  Protocols used: Hand and Wrist Pain-A-AH

## 2022-03-20 ENCOUNTER — Ambulatory Visit: Payer: BC Managed Care – PPO | Admitting: Family Medicine

## 2022-03-22 ENCOUNTER — Ambulatory Visit (INDEPENDENT_AMBULATORY_CARE_PROVIDER_SITE_OTHER): Payer: BC Managed Care – PPO | Admitting: Family Medicine

## 2022-03-22 ENCOUNTER — Ambulatory Visit
Admission: RE | Admit: 2022-03-22 | Discharge: 2022-03-22 | Disposition: A | Discharge status: Home / Self Care | Payer: BLUE CROSS/BLUE SHIELD | Attending: Family Medicine | Admitting: Family Medicine

## 2022-03-22 ENCOUNTER — Ambulatory Visit
Admission: RE | Admit: 2022-03-22 | Discharge: 2022-03-22 | Disposition: A | Discharge status: Home / Self Care | Payer: BLUE CROSS/BLUE SHIELD | Source: Ambulatory Visit | Attending: Family Medicine | Admitting: Family Medicine

## 2022-03-22 ENCOUNTER — Encounter: Payer: Self-pay | Admitting: Family Medicine

## 2022-03-22 VITALS — BP 123/83 | HR 90 | Resp 16 | Wt 208.2 lb

## 2022-03-22 DIAGNOSIS — M79642 Pain in left hand: Secondary | ICD-10-CM | POA: Diagnosis not present

## 2022-03-22 MED ORDER — IBUPROFEN 600 MG PO TABS: 600.0000 mg | ORAL_TABLET | Freq: Four times a day (QID) | ORAL | 0 refills | Status: DC | PRN

## 2022-03-22 NOTE — Assessment & Plan Note (Signed)
Non dominant hand; no known injury Able to move and touch thumb to all other fingers No concern for CTS Will continue ibuprofen to assist Xrays of wrist and hand

## 2022-03-22 NOTE — Progress Notes (Signed)
I,Sulibeya S Dimas,acting as a Neurosurgeon for [name], FNP.,have documented all relevant documentation on the behalf of [name], FNP,as directed by  [name], FNP while in the presence of [name], FNP.   Established patient visit  Patient: Rebecca Zimmerman   DOB: [birth year]   21 y.o. Female  MRN: [id number] Visit Date: 03/22/2022  Today's healthcare provider: [name], FNP  Re Introduced to nurse practitioner role and practice setting.  All questions answered.  Discussed provider/patient relationship and expectations.  Chief Complaint  Patient presents with   Numbness   Subjective    HPI  Patient presents in office today with complaints of numbness and tingling in her left hand (thumb) that began a month ago, patient states that pain was constant but now is gradually improving with thew use of ice. Patient denies previous injury to hand.   Medications: Outpatient Medications Prior to Visit  Medication Sig   [DISCONTINUED] acetaminophen (TYLENOL) 325 MG tablet Take 2 tablets (650 mg total) by mouth every 4 (four) hours as needed (for pain scale < 4). (Patient not taking: Reported on 03/22/2022)   [DISCONTINUED] ferrous sulfate 325 (65 FE) MG tablet Take 1 tablet (325 mg total) by mouth 2 (two) times daily with a meal. (Patient not taking: Reported on 03/22/2022)   [DISCONTINUED] ibuprofen (ADVIL) 600 MG tablet Take 1 tablet (600 mg total) by mouth every 6 (six) hours as needed. (Patient not taking: Reported on 03/22/2022)   [DISCONTINUED] NIFEdipine (ADALAT CC) 30 MG 24 hr tablet Take 1 tablet (30 mg total) by mouth daily. (Patient not taking: Reported on 03/22/2022)   No facility-administered medications prior to visit.   Review of Systems  Musculoskeletal:  Positive for arthralgias and myalgias.  Neurological:  Positive for numbness.     Objective    BP 123/83   Pulse 90   Resp 16   Wt 208 lb 3.2 oz (94.4 kg)   BMI 32.61 kg/m   Physical  Exam Vitals and nursing note reviewed.  Constitutional:      General: She is not in acute distress.    Appearance: Normal appearance. She is obese. She is not ill-appearing, toxic-appearing or diaphoretic.  HENT:     Head: Normocephalic and atraumatic.  Cardiovascular:     Rate and Rhythm: Normal rate and regular rhythm.     Pulses: Normal pulses.     Heart sounds: Normal heart sounds. No murmur heard.    No friction rub. No gallop.  Pulmonary:     Effort: Pulmonary effort is normal. No respiratory distress.     Breath sounds: Normal breath sounds. No stridor. No wheezing, rhonchi or rales.  Chest:     Chest wall: No tenderness.  Musculoskeletal:        General: No swelling, tenderness, deformity or signs of injury. Normal range of motion.     Right lower leg: No edema.     Left lower leg: No edema.  Skin:    General: Skin is warm and dry.     Capillary Refill: Capillary refill takes less than 2 seconds.     Coloration: Skin is not jaundiced or pale.     Findings: No bruising, erythema, lesion or rash.  Neurological:     General: No focal deficit present.     Mental Status: She is alert and oriented to person, place, and time. Mental status is at baseline.     Cranial Nerves: No cranial nerve  deficit.     Sensory: No sensory deficit.     Motor: No weakness.     Coordination: Coordination normal.  Psychiatric:        Mood and Affect: Mood normal.        Behavior: Behavior normal.        Thought Content: Thought content normal.        Judgment: Judgment normal.    No results found for any visits on 03/22/22.  Assessment & Plan     Problem List Items Addressed This Visit       Other   Hand pain, left - Primary    Non dominant hand; no known injury Able to move and touch thumb to all other fingers No concern for CTS Will continue ibuprofen to assist Xrays of wrist and hand       Relevant Medications   ibuprofen (ADVIL) 600 MG tablet   Other Relevant Orders   DG  Hand Complete Left   DG Wrist Complete Left   Return if symptoms worsen or fail to improve.     Leilani Merl, FNP, have reviewed all documentation for this visit. The documentation on 03/22/22 for the exam, diagnosis, procedures, and orders are all accurate and complete.  [name], FNP  [facility] [phone] (phone) [phone] (fax)  [facility]

## 2022-03-26 NOTE — Progress Notes (Signed)
Negative/normal xrays. Continue NSAIDs/tylenol and ice 20 mins on/off to assist.

## 2022-03-28 ENCOUNTER — Ambulatory Visit: Payer: BC Managed Care – PPO | Admitting: Family Medicine

## 2022-03-28 NOTE — Progress Notes (Deleted)
      Established patient visit   Patient: Rebecca Zimmerman   DOB: 2000-12-15   21 y.o. Female  MRN: [id number] Visit Date: 03/28/2022  Today's healthcare provider: [name], FNP   No chief complaint on file.  Subjective    HPI  Pain  She reports {chronicity:119221} Left Wrist pain. {There was/was not:31712} an injury that may have caused the pain. The pain started {onset:119223} and is {progression:119226}. The pain {does/does not:200015} radiate {describe radiation if present:1}. The pain is described as {quality:31200}, is {severity:119268} in intensity, occurring {frequency of symptoms:119294}. {Symptoms are worse in the:16448:::1}  {Aggravating factors:16449:::1} {Relieving factors:16449:::1}.  She has tried {treatments tried:173219}{improvement:119303}.   ---------------------------------------------------------------------------------------------------   Medications: Outpatient Medications Prior to Visit  Medication Sig   ibuprofen (ADVIL) 600 MG tablet Take 1 tablet (600 mg total) by mouth every 6 (six) hours as needed.   No facility-administered medications prior to visit.    Review of Systems  {Labs  Heme  Chem  Endocrine  Serology  Results Review (optional):23779}   Objective    There were no vitals taken for this visit. {Show previous vital signs (optional):23777}  Physical Exam  ***  No results found for any visits on 03/28/22.  Assessment & Plan     ***  No follow-ups on file.      {provider attestation***:1}   [name], FNP  [facility] [phone] (phone) [phone] (fax)  [facility]

## 2022-04-02 ENCOUNTER — Telehealth: Payer: Self-pay

## 2022-04-02 NOTE — Telephone Encounter (Signed)
Copied from CRM [id number]. Topic: General - Other >> Mar 25, 2022  4:06 PM Esperanza Sheets wrote: Pt requesting cb w/ 12-15 xray results  Please fu w/ pt once resulted

## 2023-01-28 ENCOUNTER — Telehealth: Payer: Self-pay

## 2023-01-28 NOTE — Telephone Encounter (Signed)
Copied from CRM [id number]. Topic: Appointment Scheduling - Scheduling Inquiry for Clinic >> Jan 28, 2023  9:28 AM Lennox Pippins wrote: Patient called in and stated she needs a physical and TB test for a new job, patient has been scheduled for physical next Tuesday, 02/04/23. Please follow back up with patient in regards to TB test. Patients callback # [phone]

## 2023-01-28 NOTE — Telephone Encounter (Signed)
Patient is not sure if a skin TB test is needed or blood. She is going to call back with information.

## 2023-01-30 DIAGNOSIS — Z111 Encounter for screening for respiratory tuberculosis: Secondary | ICD-10-CM | POA: Diagnosis not present

## 2023-02-02 DIAGNOSIS — Z111 Encounter for screening for respiratory tuberculosis: Secondary | ICD-10-CM | POA: Diagnosis not present

## 2023-02-04 ENCOUNTER — Ambulatory Visit (INDEPENDENT_AMBULATORY_CARE_PROVIDER_SITE_OTHER): Payer: BC Managed Care – PPO | Admitting: Family Medicine

## 2023-02-04 ENCOUNTER — Encounter: Payer: Self-pay | Admitting: Family Medicine

## 2023-02-04 VITALS — BP 120/75 | HR 93 | Resp 20 | Ht 66.0 in | Wt 193.2 lb

## 2023-02-04 DIAGNOSIS — F4000 Agoraphobia, unspecified: Secondary | ICD-10-CM | POA: Insufficient documentation

## 2023-02-04 DIAGNOSIS — Z0000 Encounter for general adult medical examination without abnormal findings: Secondary | ICD-10-CM | POA: Insufficient documentation

## 2023-02-04 DIAGNOSIS — Z1159 Encounter for screening for other viral diseases: Secondary | ICD-10-CM | POA: Insufficient documentation

## 2023-02-04 DIAGNOSIS — Z0001 Encounter for general adult medical examination with abnormal findings: Secondary | ICD-10-CM | POA: Diagnosis not present

## 2023-02-04 DIAGNOSIS — G4709 Other insomnia: Secondary | ICD-10-CM | POA: Diagnosis not present

## 2023-02-04 DIAGNOSIS — F424 Excoriation (skin-picking) disorder: Secondary | ICD-10-CM | POA: Insufficient documentation

## 2023-02-04 DIAGNOSIS — F411 Generalized anxiety disorder: Secondary | ICD-10-CM | POA: Diagnosis not present

## 2023-02-04 MED ORDER — SERTRALINE HCL 25 MG PO TABS: 25.0000 mg | ORAL_TABLET | Freq: Every day | ORAL | 0 refills | Status: DC

## 2023-02-04 MED ORDER — HYDROXYZINE HCL 10 MG PO TABS: 10.0000 mg | ORAL_TABLET | Freq: Three times a day (TID) | ORAL | 0 refills | Status: DC | PRN

## 2023-02-04 MED ORDER — BUSPIRONE HCL 5 MG PO TABS: 5.0000 mg | ORAL_TABLET | Freq: Three times a day (TID) | ORAL | 0 refills | Status: DC | PRN

## 2023-02-04 MED ORDER — TRAZODONE HCL 50 MG PO TABS: 25.0000 mg | ORAL_TABLET | Freq: Every evening | ORAL | 0 refills | Status: DC | PRN

## 2023-02-04 NOTE — Assessment & Plan Note (Signed)
Chronic, untreated Hoping ssri will assist Continue to monitor

## 2023-02-04 NOTE — Assessment & Plan Note (Signed)
Chronic, untreated Trial of trazodone to assist

## 2023-02-04 NOTE — Assessment & Plan Note (Signed)
Low risk screen Treatable, and curable. If left untreated Hep C can lead to cirrhosis and liver failure. Encourage routine testing; recommend repeat testing if risk factors change.  

## 2023-02-04 NOTE — Assessment & Plan Note (Signed)
Awaiting glasses Things to do to keep yourself healthy  - Exercise at least 30-45 minutes a day, 3-4 days a week.  - Eat a low-fat diet with lots of fruits and vegetables, up to 7-9 servings per day.  - Seatbelts can save your life. Wear them always.  - Smoke detectors on every level of your home, check batteries every year.  - Eye Doctor - have an eye exam every 1-2 years  - Safe sex - if you may be exposed to STDs, use a condom.  - Alcohol -  If you drink, do it moderately, less than 2 drinks per day.  - Health Care Power of Attorney. Choose someone to speak for you if you are not able.  - Depression is common in our stressful world.If you're feeling down or losing interest in things you normally enjoy, please come in for a visit.  - Violence - If anyone is threatening or hurting you, please call immediately.

## 2023-02-04 NOTE — Assessment & Plan Note (Signed)
Chronic, uncontrolled Recommend trial of ssri as well as as needed medication to assist Discussed options of buspar vs atarax vs medication to aid sleep Close f/u recommended

## 2023-02-04 NOTE — Assessment & Plan Note (Signed)
Chronic; untreated Trial of low dose zoloft to assist I've explained to her that drugs of the SSRI class can have side effects such as weight gain, sexual dysfunction, insomnia, headache, nausea. These medications are generally effective at alleviating symptoms of anxiety and/or depression. Let me know if significant side effects do occur.

## 2023-02-04 NOTE — Progress Notes (Signed)
Complete physical exam  Patient: Rebecca Zimmerman   DOB: 2000/11/03   22 y.o. Female  MRN: [id number] Visit Date: 02/04/2023  Today's healthcare provider: [name], FNP  Re-introduced to nurse practitioner role and practice setting.  All questions answered.  Discussed provider/patient relationship and expectations.  Subjective    Rebecca Zimmerman is a 22 y.o. female who presents today for a complete physical exam.   History reviewed. No pertinent past medical history. Past Surgical History:  Procedure Laterality Date   NO PAST SURGERIES     Social History   Socioeconomic History   Marital status: Single    Spouse name: Not on file   Number of children: Not on file   Years of education: Not on file   Highest education level: Not on file  Occupational History   Not on file  Tobacco Use   Smoking status: Never   Smokeless tobacco: Never  Substance and Sexual Activity   Alcohol use: No    Alcohol/week: 0.0 standard drinks of alcohol   Drug use: No   Sexual activity: Never    Birth control/protection: Pill    Comment: considering pills   Other Topics Concern   Not on file  Social History Narrative   Not on file   Social Determinants of Health   Financial Resource Strain: Not on file  Food Insecurity: Not on file  Transportation Needs: Not on file  Physical Activity: Not on file  Stress: Not on file  Social Connections: Not on file  Intimate Partner Violence: Not on file   Family Status  Relation Name Status   Mother  Alive   Father  Alive   Sister  Alive   Brother  Alive   Brother  Alive   Brother  Alive  No partnership data on file   Family History  Problem Relation Age of Onset   Epilepsy Father    Healthy Sister    Healthy Brother    Healthy Brother    Healthy Brother    No Known Allergies  Patient Care Team: Merita Norton T, FNP as PCP - General (Family Medicine)   Medications: Outpatient Medications Prior to Visit  Medication  Sig   [DISCONTINUED] ibuprofen (ADVIL) 600 MG tablet Take 1 tablet (600 mg total) by mouth every 6 (six) hours as needed.   No facility-administered medications prior to visit.    Objective    BP 120/75 (BP Location: Right Arm, Patient Position: Sitting, Cuff Size: Normal)   Pulse 93   Resp 20   Ht 5\' 6"  (1.676 m)   Wt 193 lb 3.2 oz (87.6 kg)   LMP 12/30/2022   Breastfeeding No   BMI 31.18 kg/m    Physical Exam Vitals and nursing note reviewed.  Constitutional:      General: She is awake. She is not in acute distress.    Appearance: Normal appearance. She is well-developed and well-groomed. She is obese. She is not ill-appearing, toxic-appearing or diaphoretic.  HENT:     Head: Normocephalic and atraumatic.     Jaw: There is normal jaw occlusion. No trismus, tenderness, swelling or pain on movement.     Right Ear: Hearing, tympanic membrane, ear canal and external ear normal. There is no impacted cerumen.     Left Ear: Hearing, tympanic membrane, ear canal and external ear normal. There is no impacted cerumen.     Nose: Nose normal. No congestion or rhinorrhea.  Right Turbinates: Not enlarged, swollen or pale.     Left Turbinates: Not enlarged, swollen or pale.     Right Sinus: No maxillary sinus tenderness or frontal sinus tenderness.     Left Sinus: No maxillary sinus tenderness or frontal sinus tenderness.     Mouth/Throat:     Lips: Pink.     Mouth: Mucous membranes are moist. No injury.     Tongue: No lesions.     Pharynx: Oropharynx is clear. Uvula midline. No pharyngeal swelling, oropharyngeal exudate, posterior oropharyngeal erythema or uvula swelling.     Tonsils: No tonsillar exudate or tonsillar abscesses.  Eyes:     General: Lids are normal. Lids are everted, no foreign bodies appreciated. Vision grossly intact. Gaze aligned appropriately. No allergic shiner or visual field deficit.       Right eye: No discharge.        Left eye: No discharge.     Extraocular  Movements: Extraocular movements intact.     Conjunctiva/sclera: Conjunctivae normal.     Right eye: Right conjunctiva is not injected. No exudate.    Left eye: Left conjunctiva is not injected. No exudate.    Pupils: Pupils are equal, round, and reactive to light.  Neck:     Thyroid: No thyroid mass, thyromegaly or thyroid tenderness.     Vascular: No carotid bruit.     Trachea: Trachea normal.  Cardiovascular:     Rate and Rhythm: Normal rate and regular rhythm.     Pulses: Normal pulses.          Carotid pulses are 2+ on the right side and 2+ on the left side.      Radial pulses are 2+ on the right side and 2+ on the left side.       Dorsalis pedis pulses are 2+ on the right side and 2+ on the left side.       Posterior tibial pulses are 2+ on the right side and 2+ on the left side.     Heart sounds: Normal heart sounds, S1 normal and S2 normal. No murmur heard.    No friction rub. No gallop.  Pulmonary:     Effort: Pulmonary effort is normal. No respiratory distress.     Breath sounds: Normal breath sounds and air entry. No stridor. No wheezing, rhonchi or rales.  Chest:     Chest wall: No tenderness.  Abdominal:     General: Abdomen is flat. Bowel sounds are normal. There is no distension.     Palpations: Abdomen is soft. There is no mass.     Tenderness: There is no abdominal tenderness. There is no right CVA tenderness, left CVA tenderness, guarding or rebound.     Hernia: No hernia is present.  Genitourinary:    Comments: Exam deferred; endorses period irregularity following IUD placement  Musculoskeletal:        General: No swelling, tenderness, deformity or signs of injury. Normal range of motion.     Cervical back: Full passive range of motion without pain, normal range of motion and neck supple. No edema, rigidity or tenderness. No muscular tenderness.     Right lower leg: No edema.     Left lower leg: No edema.  Lymphadenopathy:     Cervical: No cervical adenopathy.      Right cervical: No superficial, deep or posterior cervical adenopathy.    Left cervical: No superficial, deep or posterior cervical adenopathy.  Skin:    General: Skin  is warm and dry.     Capillary Refill: Capillary refill takes less than 2 seconds.     Coloration: Skin is not jaundiced or pale.     Findings: No bruising, erythema, lesion or rash.  Neurological:     General: No focal deficit present.     Mental Status: She is alert and oriented to person, place, and time. Mental status is at baseline.     GCS: GCS eye subscore is 4. GCS verbal subscore is 5. GCS motor subscore is 6.     Sensory: Sensation is intact. No sensory deficit.     Motor: Motor function is intact. No weakness.     Coordination: Coordination is intact. Coordination normal.     Gait: Gait is intact. Gait normal.  Psychiatric:        Attention and Perception: Attention and perception normal.        Mood and Affect: Mood and affect normal.        Speech: Speech normal.        Behavior: Behavior normal. Behavior is cooperative.        Thought Content: Thought content normal.        Cognition and Memory: Cognition and memory normal.        Judgment: Judgment normal.     Last depression screening scores    02/04/2023    3:59 PM 03/22/2022    2:14 PM 08/24/2021    3:46 PM  PHQ 2/9 Scores  PHQ - 2 Score 0 1 0  PHQ- 9 Score 3 2 1    Last fall risk screening    02/04/2023    3:59 PM  Fall Risk   Falls in the past year? 0  Number falls in past yr: 0  Injury with Fall? 0  Risk for fall due to : No Fall Risks  Follow up Falls evaluation completed   Last Audit-C alcohol use screening    08/24/2021    3:47 PM  Alcohol Use Disorder Test (AUDIT)  1. How often do you have a drink containing alcohol? 0  2. How many drinks containing alcohol do you have on a typical day when you are drinking? 0  3. How often do you have six or more drinks on one occasion? 0  AUDIT-C Score 0   A score of 3 or more in  women, and 4 or more in men indicates increased risk for alcohol abuse, EXCEPT if all of the points are from question 1   No results found for any visits on 02/04/23.  Assessment & Plan    Routine Health Maintenance and Physical Exam  Exercise Activities and Dietary recommendations  Goals   None     Immunization History  Administered Date(s) Administered   HPV Quadrivalent 05/19/2012, 07/21/2012   Hepatitis A 12/31/2005, 05/19/2012   Hepatitis A, Adult 12/31/2005, 05/19/2012   Hepatitis A, Ped/Adol-2 Dose 12/31/2005, 05/19/2012   Hepatitis B 11/24/2000, 07/17/2001, 12/31/2005   Hepatitis B, ADULT 11/24/2000, 07/17/2001, 12/31/2005   Hepatitis B, PED/ADOLESCENT 11/24/2000, 07/17/2001, 12/31/2005   Influenza Split 02/26/2007, 01/30/2009   MMR 11/27/2001, 11/19/2004   Tdap 05/19/2012   Varicella 11/27/2001, 01/30/2006    Health Maintenance  Topic Date Due   HPV VACCINES (3 - 2-dose series) 11/16/2012   Hepatitis C Screening  Never done   Cervical Cancer Screening (Pap smear)  Never done   DTaP/Tdap/Td (2 - Td or Tdap) 05/19/2022   COVID-19 Vaccine (1 - 2023-24 season) Never done  INFLUENZA VACCINE  07/07/2023 (Originally 11/07/2022)   HIV Screening  Completed    Discussed health benefits of physical activity, and encouraged her to engage in regular exercise appropriate for her age and condition.  Problem List Items Addressed This Visit       Other   Agoraphobia    Chronic; untreated Trial of low dose zoloft to assist I've explained to her that drugs of the SSRI class can have side effects such as weight gain, sexual dysfunction, insomnia, headache, nausea. These medications are generally effective at alleviating symptoms of anxiety and/or depression. Let me know if significant side effects do occur.       Relevant Medications   busPIRone (BUSPAR) 5 MG tablet   hydrOXYzine (ATARAX) 10 MG tablet   sertraline (ZOLOFT) 25 MG tablet   traZODone (DESYREL) 50 MG tablet    Annual physical exam - Primary    Awaiting glasses Things to do to keep yourself healthy  - Exercise at least 30-45 minutes a day, 3-4 days a week.  - Eat a low-fat diet with lots of fruits and vegetables, up to 7-9 servings per day.  - Seatbelts can save your life. Wear them always.  - Smoke detectors on every level of your home, check batteries every year.  - Eye Doctor - have an eye exam every 1-2 years  - Safe sex - if you may be exposed to STDs, use a condom.  - Alcohol -  If you drink, do it moderately, less than 2 drinks per day.  - Health Care Power of Attorney. Choose someone to speak for you if you are not able.  - Depression is common in our stressful world.If you're feeling down or losing interest in things you normally enjoy, please come in for a visit.  - Violence - If anyone is threatening or hurting you, please call immediately.       Relevant Orders   CBC with Differential/Platelet   Comprehensive Metabolic Panel (CMET)   TSH   Hemoglobin A1c   Lipid panel   Vitamin D (25 hydroxy)   Encounter for hepatitis C screening test for low risk patient    Low risk screen Treatable, and curable. If left untreated Hep C can lead to cirrhosis and liver failure. Encourage routine testing; recommend repeat testing if risk factors change.       Relevant Orders   Hepatitis C Antibody   GAD (generalized anxiety disorder)    Chronic, uncontrolled Recommend trial of ssri as well as as needed medication to assist Discussed options of buspar vs atarax vs medication to aid sleep Close f/u recommended       Relevant Medications   busPIRone (BUSPAR) 5 MG tablet   hydrOXYzine (ATARAX) 10 MG tablet   sertraline (ZOLOFT) 25 MG tablet   traZODone (DESYREL) 50 MG tablet   Other insomnia    Chronic, untreated Trial of trazodone to assist      Skin picking habit    Chronic, untreated Hoping ssri will assist Continue to monitor       Relevant Medications   busPIRone (BUSPAR)  5 MG tablet   hydrOXYzine (ATARAX) 10 MG tablet   sertraline (ZOLOFT) 25 MG tablet   traZODone (DESYREL) 50 MG tablet   Return in about 6 weeks (around 03/18/2023) for anxiety and depression.    Leilani Merl, FNP, have reviewed all documentation for this visit. The documentation on 02/04/23 for the exam, diagnosis, procedures, and orders are all accurate and  complete.  [name], FNP  [facility] [phone] (phone) [phone] (fax)  [facility]

## 2023-02-05 ENCOUNTER — Telehealth: Payer: Self-pay | Admitting: Family Medicine

## 2023-02-05 ENCOUNTER — Other Ambulatory Visit: Payer: Self-pay

## 2023-02-05 DIAGNOSIS — E559 Vitamin D deficiency, unspecified: Secondary | ICD-10-CM

## 2023-02-05 LAB — LIPID PANEL
Chol/HDL Ratio: 3.7 ratio (ref 0.0–4.4)
Cholesterol, Total: 175 mg/dL (ref 100–199)
HDL: 47 mg/dL (ref >39)
LDL Chol Calc (NIH): 107 mg/dL — ABNORMAL HIGH (ref 0–99)
Triglycerides: 114 mg/dL (ref 0–149)
VLDL Cholesterol Cal: 21 mg/dL (ref 5–40)

## 2023-02-05 LAB — COMPREHENSIVE METABOLIC PANEL
ALT: 20 [IU]/L (ref 0–32)
AST: 16 [IU]/L (ref 0–40)
Albumin: 5.1 g/dL — ABNORMAL HIGH (ref 4.0–5.0)
Alkaline Phosphatase: 90 [IU]/L (ref 44–121)
BUN/Creatinine Ratio: 14 (ref 9–23)
BUN: 11 mg/dL (ref 6–20)
Bilirubin Total: 1.5 mg/dL — ABNORMAL HIGH (ref 0.0–1.2)
CO2: 23 mmol/L (ref 20–29)
Calcium: 9.9 mg/dL (ref 8.7–10.2)
Chloride: 101 mmol/L (ref 96–106)
Creatinine, Ser: 0.76 mg/dL (ref 0.57–1.00)
Globulin, Total: 3 g/dL (ref 1.5–4.5)
Glucose: 108 mg/dL — ABNORMAL HIGH (ref 70–99)
Potassium: 4.4 mmol/L (ref 3.5–5.2)
Sodium: 139 mmol/L (ref 134–144)
Total Protein: 8.1 g/dL (ref 6.0–8.5)
eGFR: 114 mL/min/{1.73_m2} (ref >59)

## 2023-02-05 LAB — CBC WITH DIFFERENTIAL/PLATELET
Basophils Absolute: 0 10*3/uL (ref 0.0–0.2)
Basos: 0 %
EOS (ABSOLUTE): 0.1 10*3/uL (ref 0.0–0.4)
Eos: 1 %
Hematocrit: 39.8 % (ref 34.0–46.6)
Hemoglobin: 13.1 g/dL (ref 11.1–15.9)
Immature Grans (Abs): 0 10*3/uL (ref 0.0–0.1)
Immature Granulocytes: 0 %
Lymphocytes Absolute: 2.4 10*3/uL (ref 0.7–3.1)
Lymphs: 26 %
MCH: 30.3 pg (ref 26.6–33.0)
MCHC: 32.9 g/dL (ref 31.5–35.7)
MCV: 92 fL (ref 79–97)
Monocytes Absolute: 0.5 10*3/uL (ref 0.1–0.9)
Monocytes: 5 %
Neutrophils Absolute: 6.4 10*3/uL (ref 1.4–7.0)
Neutrophils: 68 %
Platelets: 324 10*3/uL (ref 150–450)
RBC: 4.33 x10E6/uL (ref 3.77–5.28)
RDW: 12.7 % (ref 11.7–15.4)
WBC: 9.4 10*3/uL (ref 3.4–10.8)

## 2023-02-05 LAB — HEMOGLOBIN A1C
Est. average glucose Bld gHb Est-mCnc: 114 mg/dL
Hgb A1c MFr Bld: 5.6 % (ref 4.8–5.6)

## 2023-02-05 LAB — HEPATITIS C ANTIBODY: Hep C Virus Ab: NONREACTIVE

## 2023-02-05 LAB — TSH: TSH: 0.929 u[IU]/mL (ref 0.450–4.500)

## 2023-02-05 LAB — VITAMIN D 25 HYDROXY (VIT D DEFICIENCY, FRACTURES): Vit D, 25-Hydroxy: 17.7 ng/mL — ABNORMAL LOW (ref 30.0–100.0)

## 2023-02-05 MED ORDER — VITAMIN D (ERGOCALCIFEROL) 1.25 MG (50000 UNIT) PO CAPS: 50000.0000 [IU] | ORAL_CAPSULE | ORAL | 1 refills | Status: DC

## 2023-02-05 NOTE — Progress Notes (Signed)
Recommend high dose Vit D treatment; either has 5000 IU daily or 50,000 Rx strength once weekly.  Other labs are normal and stable. Continue to recommend balanced, lower carb meals with reduced fat content. Smaller meal size, adding snacks. Choosing water as drink of choice and increasing purposeful exercise with goal of 150 mins/week.

## 2023-02-05 NOTE — Telephone Encounter (Signed)
Health assessment form being sent back for completion. Please call patient when done. She will pick up form.

## 2023-02-06 NOTE — Telephone Encounter (Signed)
Informed patient that medical report was completed and ready for pick up. (Left up front)

## 2023-03-13 ENCOUNTER — Telehealth: Payer: Self-pay

## 2023-03-13 NOTE — Telephone Encounter (Signed)
Copied from CRM [id number]. Topic: General - Other >> Mar 13, 2023  9:20 AM Payton Doughty wrote: Reason for CRM: pt has to reschedule her appt for tomorrow due to work issues. But pt wants Robynn Pane tp know the Zoloft and trazodone is working great. Pt will see you on 12/19 at 4 pm

## 2023-03-14 ENCOUNTER — Other Ambulatory Visit: Payer: Self-pay | Admitting: Family Medicine

## 2023-03-14 ENCOUNTER — Ambulatory Visit: Payer: BC Managed Care – PPO | Admitting: Family Medicine

## 2023-03-14 DIAGNOSIS — G4709 Other insomnia: Secondary | ICD-10-CM

## 2023-03-14 DIAGNOSIS — F424 Excoriation (skin-picking) disorder: Secondary | ICD-10-CM

## 2023-03-14 DIAGNOSIS — F4000 Agoraphobia, unspecified: Secondary | ICD-10-CM

## 2023-03-14 DIAGNOSIS — F411 Generalized anxiety disorder: Secondary | ICD-10-CM

## 2023-03-14 MED ORDER — SERTRALINE HCL 25 MG PO TABS: 25.0000 mg | ORAL_TABLET | Freq: Every day | ORAL | 3 refills | Status: DC

## 2023-03-14 MED ORDER — TRAZODONE HCL 50 MG PO TABS: 25.0000 mg | ORAL_TABLET | Freq: Every evening | ORAL | 3 refills | Status: AC | PRN

## 2023-03-27 ENCOUNTER — Ambulatory Visit: Payer: BC Managed Care – PPO | Admitting: Family Medicine

## 2023-03-27 DIAGNOSIS — Z91199 Patient's noncompliance with other medical treatment and regimen due to unspecified reason: Secondary | ICD-10-CM

## 2023-03-27 NOTE — Progress Notes (Signed)
Patient was not seen for appt d/t no call, no show, or late arrival >10 mins past appt time.   Elise T Payne, FNP  [facility] [address] [place], [place] [address] [phone] (phone) [phone] (fax) [facility]  

## 2023-06-27 DIAGNOSIS — H10029 Other mucopurulent conjunctivitis, unspecified eye: Secondary | ICD-10-CM | POA: Diagnosis not present

## 2023-11-25 ENCOUNTER — Encounter: Payer: Self-pay | Admitting: Family Medicine

## 2023-11-25 ENCOUNTER — Other Ambulatory Visit (INDEPENDENT_AMBULATORY_CARE_PROVIDER_SITE_OTHER): Admitting: Family Medicine

## 2023-11-25 ENCOUNTER — Ambulatory Visit (INDEPENDENT_AMBULATORY_CARE_PROVIDER_SITE_OTHER): Admitting: Family Medicine

## 2023-11-25 VITALS — BP 116/79 | HR 82 | Resp 14 | Ht 66.0 in | Wt 191.3 lb

## 2023-11-25 DIAGNOSIS — F424 Excoriation (skin-picking) disorder: Secondary | ICD-10-CM

## 2023-11-25 DIAGNOSIS — F4000 Agoraphobia, unspecified: Secondary | ICD-10-CM

## 2023-11-25 DIAGNOSIS — Z111 Encounter for screening for respiratory tuberculosis: Secondary | ICD-10-CM | POA: Diagnosis not present

## 2023-11-25 DIAGNOSIS — F411 Generalized anxiety disorder: Secondary | ICD-10-CM | POA: Diagnosis not present

## 2023-11-25 DIAGNOSIS — G4709 Other insomnia: Secondary | ICD-10-CM

## 2023-11-25 MED ORDER — SERTRALINE HCL 25 MG PO TABS: 25.0000 mg | ORAL_TABLET | Freq: Every day | ORAL | 3 refills | Status: AC

## 2023-11-25 NOTE — Progress Notes (Unsigned)
 Established patient visit   Patient: Rebecca Zimmerman   DOB: Jul 20, 2000   23 y.o. Female  MRN: [id number] Visit Date: 11/25/2023  Today's healthcare provider: Rockie Agent, MD   Chief Complaint  Patient presents with   Employment Physical    Pattern of eating: 2 meals a day   Pattern of sleep: 7 hours of sleep  Are you exercising: yes  What type of exercising: cardio (chasing her kids)  How long: 1 hour  How frequent: daily   Vaccine: Decline vaccines   Subjective     HPI     Employment Physical    Additional comments: Pattern of eating: 2 meals a day   Pattern of sleep: 7 hours of sleep  Are you exercising: yes  What type of exercising: cardio (chasing her kids)  How long: 1 hour  How frequent: daily   Vaccine: Decline vaccines      Last edited by Wilfred Hargis RAMAN, CMA on 11/25/2023  1:24 PM.       Discussed the use of AI scribe software for clinical note transcription with the patient, who gave verbal consent to proceed.  History of Present Illness Rebecca Zimmerman is a 23 year old female who presents for tuberculosis screening for employment purposes.  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 She had a negative tuberculosis quantiferon gold test on February 02, 2023, which remains valid for her employment requirements.  She takes Zoloft  25 mg once daily for anxiety, which has significantly improved her driving anxiety, allowing her to drive regularly after not driving for three years. She also takes trazodone  as needed for sleep and reports having an adequate supply. She has tried buspirone  and hydroxyzine  in the past but discontinued them due to adverse effects, including chest pain and feeling disoriented.  She lives in [place] but spends most of her time at her father's house in [place]. She prefers to use the [facility] in [place] for her prescriptions.  No issues with  diarrhea or other gastrointestinal symptoms.     History reviewed. No pertinent past medical history.  Medications: Outpatient Medications Prior to Visit  Medication Sig   traZODone  (DESYREL ) 50 MG tablet Take 0.5-1 tablets (25-50 mg total) by mouth at bedtime as needed for sleep.   [DISCONTINUED] busPIRone  (BUSPAR ) 5 MG tablet Take 1 tablet (5 mg total) by mouth 3 (three) times daily as needed.   [DISCONTINUED] hydrOXYzine  (ATARAX ) 10 MG tablet Take 1 tablet (10 mg total) by mouth 3 (three) times daily as needed.   [DISCONTINUED] sertraline  (ZOLOFT ) 25 MG tablet Take 1 tablet (25 mg total) by mouth daily.   [DISCONTINUED] Vitamin D , Ergocalciferol , (DRISDOL ) 1.25 MG (50000 UNIT) CAPS capsule Take 1 capsule (50,000 Units total) by mouth every 7 (seven) days.   No facility-administered medications prior to visit.    Review of Systems  Last metabolic panel Lab Results  Component Value Date   GLUCOSE 108 (H) 02/04/2023   NA 139 02/04/2023   K 4.4 02/04/2023   CL 101 02/04/2023   CO2 23 02/04/2023   BUN 11 02/04/2023   CREATININE 0.76 02/04/2023   EGFR 114 02/04/2023   CALCIUM 9.9 02/04/2023   PROT 8.1 02/04/2023   ALBUMIN 5.1 (H) 02/04/2023   LABGLOB 3.0 02/04/2023   AGRATIO 2.0 05/18/2015   BILITOT 1.5 (H) 02/04/2023   ALKPHOS 90 02/04/2023   AST 16 02/04/2023   ALT 20 02/04/2023  ANIONGAP 10 09/16/2021   Last lipids Lab Results  Component Value Date   CHOL 175 02/04/2023   HDL 47 02/04/2023   LDLCALC 107 (H) 02/04/2023   TRIG 114 02/04/2023   CHOLHDL 3.7 02/04/2023   Last hemoglobin A1c Lab Results  Component Value Date   HGBA1C 5.6 02/04/2023   Last thyroid functions Lab Results  Component Value Date   TSH 0.929 02/04/2023        Objective    BP 116/79 (BP Location: Right Arm, Patient Position: Sitting, Cuff Size: Normal)   Pulse 82   Resp 14   Ht 5' 6 (1.676 m)   Wt 191 lb 4.8 oz (86.8 kg)   SpO2 100%   BMI 30.88 kg/m  BP Readings from Last  3 Encounters:  11/25/23 116/79  02/04/23 120/75  03/22/22 123/83   Wt Readings from Last 3 Encounters:  11/25/23 191 lb 4.8 oz (86.8 kg)  02/04/23 193 lb 3.2 oz (87.6 kg)  03/22/22 208 lb 3.2 oz (94.4 kg)        Physical Exam Vitals reviewed.  Constitutional:      General: She is not in acute distress.    Appearance: Normal appearance. She is normal weight. She is not ill-appearing, toxic-appearing or diaphoretic.     Comments: Well groomed, calmly sitting   Cardiovascular:     Rate and Rhythm: Normal rate and regular rhythm.  Pulmonary:     Effort: Pulmonary effort is normal. No respiratory distress.     Breath sounds: No wheezing, rhonchi or rales.  Neurological:     Mental Status: She is alert and oriented to person, place, and time.  Psychiatric:        Attention and Perception: Attention and perception normal. She is attentive. She does not perceive auditory or visual hallucinations.        Mood and Affect: Mood and affect normal.        Speech: Speech normal.        Behavior: Behavior normal. Behavior is cooperative.        Thought Content: Thought content normal. Thought content is not paranoid or delusional. Thought content does not include homicidal or suicidal ideation. Thought content does not include homicidal or suicidal plan.        Judgment: Judgment normal.       No results found for any visits on 11/25/23.  Assessment & Plan     Problem List Items Addressed This Visit       Other   Other insomnia   GAD (generalized anxiety disorder)   Agoraphobia - Primary   Other Visit Diagnoses       Screening-pulmonary TB           Assessment & Plan Tuberculosis screening for employment Completed tuberculosis screening form for employment. Negative tuberculosis quantiferon test from February 02, 2023, remains valid. No signs of tuberculosis based on screening questions. - Scan and return completed tuberculosis screening form with signature. - forms  returned to patient   Depression Chronic  Depression managed with Zoloft  25 mg once daily. Significant improvement in driving anxiety and overall mood. Mood described as 'okay'. - Refill Zoloft  25 mg once daily prescription.  Insomnia Chronic  Insomnia managed with trazodone  as needed for sleep. Adequate supply of trazodone , no refill needed. - continue trazodone  25-50mg  at bedtime prn     Return in about 3 months (around 02/25/2024) for CPE.         Rockie Agent, MD  [facility] [phone] (phone) [phone] (fax)  [facility]

## 2023-12-23 ENCOUNTER — Ambulatory Visit

## 2023-12-23 ENCOUNTER — Telehealth: Payer: Self-pay

## 2023-12-23 ENCOUNTER — Ambulatory Visit: Payer: Self-pay

## 2023-12-23 DIAGNOSIS — S5001XA Contusion of right elbow, initial encounter: Secondary | ICD-10-CM | POA: Diagnosis not present

## 2023-12-23 NOTE — Telephone Encounter (Signed)
 Spoke to patient and she has appointment to be seen tomorrow

## 2023-12-23 NOTE — Telephone Encounter (Signed)
 FYI Only or Action Required?: FYI only for provider.  Patient was last seen in primary care on 11/25/2023 by Sharma Coyer, MD.  Called Nurse Triage reporting Fall.  Symptoms began yesterday.  Interventions attempted: Nothing.  Symptoms are: gradually worsening.  Triage Disposition: appt today Patient/caregiver understands and will follow disposition?: yes     Copied from CRM #[id number]. Topic: Clinical - Red Word Triage >> Dec 23, 2023  8:48 AM Turkey B wrote: [facility] that prompted transfer to Nurse Triage: patient's father called in, patient fell last night while in tub Reason for Disposition  [1] Chest wall bruise, pain, or swelling AND [2] present < 7 days    Pain is severe  Answer Assessment - Initial Assessment Questions 1. MECHANISM: How did the fall happen?     Slip Fell in bathroom 2. DOMESTIC VIOLENCE AND ELDER ABUSE SCREENING: Did you fall because someone pushed you or tried to hurt you? If Yes, ask: Are you safe now?     N/a 3. ONSET: When did the fall happen? (e.g., minutes, hours, or days ago)     Last night  4. LOCATION: What part of the body hit the ground? (e.g., back, buttocks, head, hips, knees, hands, head, stomach)     Right side, right arm, right knee 5. INJURY: Did you hurt (injure) yourself when you fell? If Yes, ask: What did you injure? Tell me more about this? (e.g., body area; type of injury; pain severity)     Yes  6. PAIN: Is there any pain? If Yes, ask: How bad is the pain? (e.g., Scale 0-10; or none, mild,      Elbow top part of ribs 11 knee 6  7. SIZE: For cuts, bruises, or swelling, ask: How large is it? (e.g., inches or centimeters)      Right knee small cut  9. OTHER SYMPTOMS: Do you have any other symptoms? (e.g., dizziness, fever, weakness; new-onset or worsening).     Right hand Fingers were numb and tingly 10. CAUSE: What do you think caused the fall (or falling)? (e.g., dizzy spell, tripped)        slipped  Answer Assessment - Initial Assessment Questions 1. MECHANISM: How did the injury happen?     Slipped and fell in bathtub 2. ONSET: When did the injury happen? (.e.g., minutes, hours, days ago)     Last night 3. LOCATION: Where on the chest is the injury located?     Right side of chest and upper chest near axilla  5. BLEEDING: Is there any bleeding now? If Yes, ask: How long has it been bleeding?     No only to right knee 6. SEVERITY: Any difficulty with breathing?     no 8. PAIN: Is there pain? If Yes, ask: How bad is the pain? (e.g., Scale 0-10; none, mild, moderate, severe)     severe  Protocols used: Falls and Falling-A-AH, Chest Injury-A-AH

## 2023-12-23 NOTE — Telephone Encounter (Signed)
 I would recommend being seen my orthopedics to follow up on the xrays in their ortho urgent care

## 2023-12-23 NOTE — Telephone Encounter (Signed)
 Do you know what is going on with her? She had an appointment but said she went to [facility] for x-rays  Copied from CRM [id number]. Topic: Appointments - Scheduling Inquiry for Clinic >> Dec 23, 2023 12:40 PM Rebecca Zimmerman wrote: Reason for CRM: patient and patients step mom called to find out what she needs to do as she went to [facility] but she never seen the provider they only did xrays. They want to know if she would need to come back and see the provider she was scheduled to see today.

## 2023-12-23 NOTE — Telephone Encounter (Signed)
 Patient call note and symptoms reviewed. Agree with scheduled appt.

## 2023-12-24 ENCOUNTER — Ambulatory Visit

## 2023-12-24 NOTE — Telephone Encounter (Signed)
 Rebecca Zimmerman

## 2023-12-25 ENCOUNTER — Ambulatory Visit
Admission: RE | Admit: 2023-12-25 | Discharge: 2023-12-25 | Disposition: A | Discharge status: Home / Self Care | Source: Ambulatory Visit | Attending: Family Medicine | Admitting: Family Medicine

## 2023-12-25 ENCOUNTER — Telehealth: Payer: Self-pay

## 2023-12-25 ENCOUNTER — Telehealth (INDEPENDENT_AMBULATORY_CARE_PROVIDER_SITE_OTHER): Admitting: Family Medicine

## 2023-12-25 ENCOUNTER — Encounter: Payer: Self-pay | Admitting: Family Medicine

## 2023-12-25 DIAGNOSIS — W19XXXA Unspecified fall, initial encounter: Secondary | ICD-10-CM | POA: Insufficient documentation

## 2023-12-25 DIAGNOSIS — M25521 Pain in right elbow: Secondary | ICD-10-CM

## 2023-12-25 DIAGNOSIS — R0781 Pleurodynia: Secondary | ICD-10-CM | POA: Diagnosis not present

## 2023-12-25 MED ORDER — MELOXICAM 15 MG PO TABS: 15.0000 mg | ORAL_TABLET | Freq: Every day | ORAL | 0 refills | Status: AC

## 2023-12-25 NOTE — Telephone Encounter (Signed)
 No answer lvm to return call to reschedule appt.

## 2023-12-25 NOTE — Progress Notes (Signed)
 MyChart Video Visit    Virtual Visit via Video Note   This format is felt to be most appropriate for this patient at this time. Physical exam was limited by quality of the video and audio technology used for the visit.    Patient location: home Provider location: [facility] Persons involved in the visit: patient, provider  I discussed the limitations of evaluation and management by telemedicine and the availability of in person appointments. The patient expressed understanding and agreed to proceed.  Patient: Rebecca Zimmerman   DOB: 2000/07/24   23 y.o. Female  MRN: [id number] Visit Date: 12/25/2023  Today's healthcare provider: Jon Eva, MD   No chief complaint on file.  Subjective    HPI   Discussed the use of AI scribe software for clinical note transcription with the patient, who gave verbal consent to proceed.  History of Present Illness   Rebecca Zimmerman is a 23 year old female who presents with right elbow and rib pain following a fall.  Two or three nights ago, she fell while getting out of the bathtub, impacting her right elbow and rib area. An x-ray at [facility] showed no fracture in the elbow. Persistent pain in the right elbow occurs, especially with use, and tingling in the fingers is noted, particularly when driving. The pain affects her ability to care for her two young children. Rib pain is present when laughing, sneezing, or sitting in the car, and she cannot lie on her right side at night. No breathing difficulties are reported. A scratch and bruising on the knee are present but not significantly painful. She is currently taking Zoloft  and trazodone  for anxiety and has not picked up the prescribed medication due to concerns about interactions.         Review of Systems      Objective    There were no vitals taken for this visit.      Physical Exam Constitutional:      General: She is not in acute  distress.    Appearance: Normal appearance.  HENT:     Head: Normocephalic.  Pulmonary:     Effort: Pulmonary effort is normal. No respiratory distress.  Neurological:     Mental Status: She is alert and oriented to person, place, and time. Mental status is at baseline.        Assessment & Plan     Problem List Items Addressed This Visit   None Visit Diagnoses       Rib pain    -  Primary   Relevant Orders   DG Ribs Unilateral Right     Fall, initial encounter       Relevant Orders   DG Ribs Unilateral Right     Right elbow pain               Right elbow contusion with ulnar nerve inflammation Right elbow contusion with associated ulnar nerve inflammation causing tingling in fingers. Persistent pain and tingling, exacerbated by use, likely due to inflammation of the ulnar nerve. No fracture noted on x-ray at [facility]. She is hesitant to take prescribed medication due to concerns about interaction with anxiety medications. - Prescribe meloxicam  15 mg daily for pain management, safe to take with current anxiety medications. - Advise to avoid taking other NSAIDs like ibuprofen  or Aleve while on meloxicam , but Tylenol  is permissible. - Recommend taking meloxicam  with food to minimize gastrointestinal discomfort. - Advise consistent  use of meloxicam  for two weeks, then as needed if symptoms improve. - Recommend applying ice to the elbow to reduce inflammation and alleviate nerve pain.  Right rib pain after fall (rule out fracture) Right rib pain following a fall, with pain exacerbated by laughing, sneezing, and certain positions. No rib x-ray performed at [facility]. Differential includes rib fracture, though management would not differ significantly as rib fractures typically heal on their own. X-ray recommended to set expectations for recovery time and pain duration. - Order rib x-ray at the outpatient imaging center across from [facility] to rule out  fracture. - rest, ice, meloxicam  as above      Meds ordered this encounter  Medications   meloxicam  (MOBIC ) 15 MG tablet    Sig: Take 1 tablet (15 mg total) by mouth daily.    Dispense:  30 tablet    Refill:  0     Return if symptoms worsen or fail to improve.     I discussed the assessment and treatment plan with the patient. The patient was provided an opportunity to ask questions and all were answered. The patient agreed with the plan and demonstrated an understanding of the instructions.   The patient was advised to call back or seek an in-person evaluation if the symptoms worsen or if the condition fails to improve as anticipated.    Jon Eva, MD [facility] [phone] (phone) [phone] (fax)  [facility]

## 2023-12-26 ENCOUNTER — Ambulatory Visit: Payer: Self-pay | Admitting: Family Medicine

## 2024-01-08 NOTE — Telephone Encounter (Signed)
 Please print the form and fill in what you can and I will complete.  I would advise her to talk to Ortho about why she needs the MRI that they ordered.

## 2024-01-28 ENCOUNTER — Ambulatory Visit

## 2024-01-28 ENCOUNTER — Telehealth: Payer: Self-pay | Admitting: Family Medicine

## 2024-01-28 ENCOUNTER — Other Ambulatory Visit: Payer: Self-pay | Admitting: Family Medicine

## 2024-01-28 DIAGNOSIS — Z111 Encounter for screening for respiratory tuberculosis: Secondary | ICD-10-CM | POA: Diagnosis not present

## 2024-01-28 NOTE — Telephone Encounter (Signed)
 Pt dropped off form to be completed for TB screening.  Patient had labs done today.  Form given to [facility]  Call pt when results come in and form has been completed.

## 2024-01-28 NOTE — Progress Notes (Signed)
 Patient needs TB screening for employment forms

## 2024-01-29 NOTE — Telephone Encounter (Signed)
 Received form, patient's portion is complete. Waiting for results and for provider's signature. Will call patient to pickup when ready.

## 2024-01-31 ENCOUNTER — Ambulatory Visit: Payer: Self-pay | Admitting: Family Medicine

## 2024-01-31 LAB — QUANTIFERON-TB GOLD PLUS
QuantiFERON Mitogen Value: >10 [IU]/mL
QuantiFERON Nil Value: 0.03 [IU]/mL
QuantiFERON TB1 Ag Value: 0.04 [IU]/mL
QuantiFERON TB2 Ag Value: 0.05 [IU]/mL
QuantiFERON-TB Gold Plus: NEGATIVE

## 2024-02-05 ENCOUNTER — Encounter: Admitting: Family Medicine

## 2024-02-11 ENCOUNTER — Encounter: Payer: Self-pay | Admitting: Family Medicine

## 2024-02-11 ENCOUNTER — Ambulatory Visit (INDEPENDENT_AMBULATORY_CARE_PROVIDER_SITE_OTHER): Admitting: Family Medicine

## 2024-02-11 VITALS — BP 128/70 | HR 114 | Temp 99.0°F | Ht 66.0 in | Wt 195.6 lb

## 2024-02-11 DIAGNOSIS — Z130 Encounter for screening for diseases of the blood and blood-forming organs and certain disorders involving the immune mechanism: Secondary | ICD-10-CM | POA: Diagnosis not present

## 2024-02-11 DIAGNOSIS — Z131 Encounter for screening for diabetes mellitus: Secondary | ICD-10-CM | POA: Diagnosis not present

## 2024-02-11 DIAGNOSIS — G4709 Other insomnia: Secondary | ICD-10-CM

## 2024-02-11 DIAGNOSIS — Z13220 Encounter for screening for lipoid disorders: Secondary | ICD-10-CM | POA: Diagnosis not present

## 2024-02-11 DIAGNOSIS — Z0000 Encounter for general adult medical examination without abnormal findings: Secondary | ICD-10-CM | POA: Diagnosis not present

## 2024-02-11 DIAGNOSIS — E66811 Obesity, class 1: Secondary | ICD-10-CM

## 2024-02-11 DIAGNOSIS — F411 Generalized anxiety disorder: Secondary | ICD-10-CM

## 2024-02-11 DIAGNOSIS — R030 Elevated blood-pressure reading, without diagnosis of hypertension: Secondary | ICD-10-CM

## 2024-02-11 NOTE — Progress Notes (Signed)
 Complete physical exam   Patient: Rebecca Zimmerman   DOB: 28-Jul-2000   23 y.o. Female  MRN: [id number] Visit Date: 02/11/2024  Today's healthcare provider: Rockie Agent, MD   Chief Complaint  Patient presents with   Annual Exam    Patient is present for annual exam.  Diet is normal per patient. No current exercise regimen other than with children  Vaccines: Flu, HPV, Men B, Tdap- all declined Screenings: Pap- will schedule for later date    Subjective    Rebecca Zimmerman is a 23 y.o. female who presents today for a complete physical exam.    She does not have additional problems to discuss today.   Discussed the use of AI scribe software for clinical note transcription with the patient, who gave verbal consent to proceed.  History of Present Illness Rebecca Zimmerman is a 23 year old female who presents for an annual physical exam. She is accompanied by her children.  She has a history of agoraphobia, generalized anxiety, insomnia, and skin picking. She is currently taking trazodone  and sertraline  for her anxiety and insomnia. The sertraline  helps with her anxiety, but she sometimes forgets to take it at night, which affects her sleep.  Her BMI is 31.57. She keeps busy with her children and does not follow any specific diet, stating 'I eat what she eats.'  She mentions having high blood pressure after her pregnancy, which required medication adjustments at that time, but she has been fine since December.  She has not had a Pap smear since her six-week postpartum appointment, where an IUD was placed. She was told she was healthy at that time, and the Pap smear was not performed.  She takes meloxicam  as needed for arm soreness, but not frequently. She also has a history of wearing a patch as a child for a lazy eye.     History reviewed. No pertinent past medical history. Past Surgical History:  Procedure Laterality Date   NO PAST SURGERIES      Social History   Socioeconomic History   Marital status: Single    Spouse name: Not on file   Number of children: Not on file   Years of education: Not on file   Highest education level: Not on file  Occupational History   Not on file  Tobacco Use   Smoking status: Never   Smokeless tobacco: Never  Substance and Sexual Activity   Alcohol use: No    Alcohol/week: 0.0 standard drinks of alcohol   Drug use: No   Sexual activity: Never    Birth control/protection: Pill    Comment: considering pills   Other Topics Concern   Not on file  Social History Narrative   Not on file   Social Drivers of Health   Financial Resource Strain: Low Risk  (11/25/2023)   Overall Financial Resource Strain (CARDIA)    Difficulty of Paying Living Expenses: Not hard at all  Food Insecurity: No Food Insecurity (11/25/2023)   Hunger Vital Sign    Worried About Running Out of Food in the Last Year: Never true    Ran Out of Food in the Last Year: Never true  Transportation Needs: No Transportation Needs (11/25/2023)   PRAPARE - Administrator, Civil Service (Medical): No    Lack of Transportation (Non-Medical): No  Physical Activity: Sufficiently Active (11/25/2023)   Exercise Vital Sign    Days of Exercise per Week: 7 days  Minutes of Exercise per Session: 60 min  Stress: No Stress Concern Present (11/25/2023)   [employer] - Occupational Stress Questionnaire    Feeling of Stress: Only a little  Social Connections: Not on file  Intimate Partner Violence: Not At Risk (11/25/2023)   Humiliation, Afraid, Rape, and Kick questionnaire    Fear of Current or Ex-Partner: No    Emotionally Abused: No    Physically Abused: No    Sexually Abused: No   Family Status  Relation Name Status   Mother  Alive   Father  Alive   Sister  Alive   Brother  Alive   Brother  Alive   Brother  Alive  No partnership data on file   Family History  Problem Relation Age of  Onset   Epilepsy Father    Healthy Sister    Healthy Brother    Healthy Brother    Healthy Brother    No Known Allergies   Medications: Outpatient Medications Prior to Visit  Medication Sig   meloxicam  (MOBIC ) 15 MG tablet Take 1 tablet (15 mg total) by mouth daily.   sertraline  (ZOLOFT ) 25 MG tablet Take 1 tablet (25 mg total) by mouth daily.   traZODone  (DESYREL ) 50 MG tablet Take 0.5-1 tablets (25-50 mg total) by mouth at bedtime as needed for sleep.   No facility-administered medications prior to visit.    Review of Systems  Last CBC Lab Results  Component Value Date   WBC 9.4 02/04/2023   HGB 13.1 02/04/2023   HCT 39.8 02/04/2023   MCV 92 02/04/2023   MCH 30.3 02/04/2023   RDW 12.7 02/04/2023   PLT 324 02/04/2023   Last metabolic panel Lab Results  Component Value Date   GLUCOSE 108 (H) 02/04/2023   NA 139 02/04/2023   K 4.4 02/04/2023   CL 101 02/04/2023   CO2 23 02/04/2023   BUN 11 02/04/2023   CREATININE 0.76 02/04/2023   EGFR 114 02/04/2023   CALCIUM 9.9 02/04/2023   PROT 8.1 02/04/2023   ALBUMIN 5.1 (H) 02/04/2023   LABGLOB 3.0 02/04/2023   AGRATIO 2.0 05/18/2015   BILITOT 1.5 (H) 02/04/2023   ALKPHOS 90 02/04/2023   AST 16 02/04/2023   ALT 20 02/04/2023   ANIONGAP 10 09/16/2021   Last lipids Lab Results  Component Value Date   CHOL 175 02/04/2023   HDL 47 02/04/2023   LDLCALC 107 (H) 02/04/2023   TRIG 114 02/04/2023   CHOLHDL 3.7 02/04/2023   Last hemoglobin A1c Lab Results  Component Value Date   HGBA1C 5.6 02/04/2023   Last thyroid functions Lab Results  Component Value Date   TSH 0.929 02/04/2023   Last vitamin D  Lab Results  Component Value Date   VD25OH 17.7 (L) 02/04/2023   Last vitamin B12 and Folate No results found for: VITAMINB12, FOLATE     Objective    BP 128/70 (Cuff Size: Normal)   Pulse (!) 114   Temp 99 F (37.2 C) (Oral)   Ht 5' 6 (1.676 m)   Wt 195 lb 9.6 oz (88.7 kg)   SpO2 99%   BMI 31.57  kg/m   BP Readings from Last 3 Encounters:  02/11/24 128/70  11/25/23 116/79  02/04/23 120/75   Wt Readings from Last 3 Encounters:  02/11/24 195 lb 9.6 oz (88.7 kg)  11/25/23 191 lb 4.8 oz (86.8 kg)  02/04/23 193 lb 3.2 oz (87.6 kg)  Physical Exam Vitals reviewed.  Constitutional:      General: She is not in acute distress.    Appearance: Normal appearance. She is not ill-appearing, toxic-appearing or diaphoretic.  HENT:     Head: Normocephalic and atraumatic.     Right Ear: Tympanic membrane and external ear normal. There is no impacted cerumen.     Left Ear: Tympanic membrane and external ear normal. There is no impacted cerumen.     Nose: Nose normal.     Mouth/Throat:     Pharynx: Oropharynx is clear.  Eyes:     General: No scleral icterus.    Extraocular Movements: Extraocular movements intact.     Conjunctiva/sclera: Conjunctivae normal.     Pupils: Pupils are equal, round, and reactive to light.  Cardiovascular:     Rate and Rhythm: Normal rate and regular rhythm.     Pulses: Normal pulses.     Heart sounds: Normal heart sounds. No murmur heard.    No friction rub. No gallop.  Pulmonary:     Effort: Pulmonary effort is normal. No respiratory distress.     Breath sounds: Normal breath sounds. No wheezing, rhonchi or rales.  Abdominal:     General: Bowel sounds are normal. There is no distension.     Palpations: Abdomen is soft. There is no mass.     Tenderness: There is no abdominal tenderness. There is no guarding.  Musculoskeletal:        General: No deformity.     Cervical back: Normal range of motion and neck supple.     Right lower leg: No edema.     Left lower leg: No edema.  Lymphadenopathy:     Cervical: No cervical adenopathy.  Skin:    General: Skin is warm.     Capillary Refill: Capillary refill takes less than 2 seconds.     Findings: No erythema or rash.  Neurological:     General: No focal deficit present.     Mental Status: She is  alert and oriented to person, place, and time.     Cranial Nerves: Cranial nerves 2-12 are intact. No cranial nerve deficit or facial asymmetry.     Motor: Motor function is intact. No weakness.     Gait: Gait normal.  Psychiatric:        Mood and Affect: Mood normal.        Behavior: Behavior normal.       Last depression screening scores    11/25/2023    1:08 PM 02/04/2023    3:59 PM 03/22/2022    2:14 PM  PHQ 2/9 Scores  PHQ - 2 Score 0 0 1  PHQ- 9 Score 1 3 2     Last fall risk screening    11/25/2023    1:08 PM  Fall Risk   Falls in the past year? 0  Number falls in past yr: 0  Injury with Fall? 0  Risk for fall due to : No Fall Risks    Last Audit-C alcohol use screening    11/25/2023    1:06 PM  Alcohol Use Disorder Test (AUDIT)  1. How often do you have a drink containing alcohol? 0  2. How many drinks containing alcohol do you have on a typical day when you are drinking? 0  3. How often do you have six or more drinks on one occasion? 0  AUDIT-C Score 0   A score of 3 or more in women, and 4 or  more in men indicates increased risk for alcohol abuse, EXCEPT if all of the points are from question 1   No results found for any visits on 02/11/24.  Assessment & Plan    Routine Health Maintenance and Physical Exam  Immunization History  Administered Date(s) Administered   DTaP 12/19/2000, 02/13/2001, 04/17/2001, 01/22/2002   Dtap, Unspecified 11/19/2004   HIB, Unspecified 12/19/2000, 02/13/2001, 04/17/2001, 01/22/2002   HPV Quadrivalent 05/19/2012, 07/21/2012   Hepatitis A 12/31/2005, 05/19/2012   Hepatitis A, Adult 12/31/2005, 05/19/2012   Hepatitis A, Ped/Adol-2 Dose 12/31/2005, 05/19/2012   Hepatitis B 11/24/2000, 07/17/2001, 12/31/2005   Hepatitis B, ADULT 11/24/2000, 07/17/2001, 12/31/2005   Hepatitis B, PED/ADOLESCENT 11/24/2000, 07/17/2001, 12/31/2005   IPV 12/19/2000, 02/13/2001, 07/17/2001   Influenza Split 02/26/2007, 01/30/2009   Influenza,  Seasonal, Injecte, Preservative Fre 01/28/2008   MMR 11/27/2001, 11/19/2004   Pneumococcal Conjugate PCV 7 02/13/2001, 04/17/2001, 07/17/2001, 11/27/2001   Polio, Unspecified 11/19/2004   Tdap 05/19/2012   Varicella 11/27/2001, 01/30/2006    Health Maintenance  Topic Date Due   Cervical Cancer Screening (Pap smear)  Never done   COVID-19 Vaccine (1 - 2025-26 season) 02/27/2024 (Originally 12/08/2023)   Influenza Vaccine  07/06/2024 (Originally 11/07/2023)   DTaP/Tdap/Td (7 - Td or Tdap) 02/10/2025 (Originally 05/19/2022)   HPV VACCINES (3 - 2-dose series) 02/10/2025 (Originally 11/16/2012)   Meningococcal B Vaccine (1 of 2 - Standard) 02/10/2025 (Originally 10/11/2016)   Hepatitis B Vaccines 19-59 Average Risk  Completed   Hepatitis C Screening  Completed   HIV Screening  Completed   Pneumococcal Vaccine  Aged Out    Problem List Items Addressed This Visit     Annual physical exam - Primary   GAD (generalized anxiety disorder)   Relevant Orders   TSH + free T4   Other insomnia   Relevant Orders   TSH + free T4   Other Visit Diagnoses       Screening for deficiency anemia       Relevant Orders   CBC     Screening for diabetes mellitus       Relevant Orders   Hemoglobin A1c     Screening for lipid disorders       Relevant Orders   Lipid panel     Obesity, Class I, BMI 30-34.9       Relevant Orders   Hemoglobin A1c   CMP14+EGFR   Lipid panel   TSH + free T4     Elevated blood pressure reading           Assessment and Plan Assessment & Plan Adult Wellness Visit Annual physical examination conducted. Blood pressure elevated at 126/94. Declined flu, COVID, tetanus booster, HPV, and meningococcal B vaccines. Cervical cancer screening recommended but not scheduled. - Ordered A1c, TSH, CMP, CBC, and lipid panel for diabetes, hypercholesterolemia, and anemia screening - Recommended exercise for 200-240 minutes per week - Recommended well-balanced diet - Recommended cervical  cancer screening - pt will return for labs when she does not have her children with her at a later date  Generalized anxiety disorder with agoraphobia Chronic  Anxiety worsens when sertraline  is not taken, particularly at night. - Continue sertraline  for anxiety management  Insomnia Managed with trazodone , taken rarely. Anxiety worsens when trazodone  is not taken at night. - Continue trazodone  as needed for sleep  Obesity, class 1 BMI is 31.57, placing her in class 1 obesity. Lifestyle modifications recommended. - Recommended exercise for 200-240 minutes per week - Recommended well-balanced diet  Elevated blood pressure reading Blood pressure elevated at 126/94. Previous hypertension post-IUD insertion, managed with medication adjustments. Current reading is concerning due to elevated diastolic pressure. - Rechecked blood pressure    TB results form was signed and given to patient at the end of today's appointment   Return in about 6 months (around 08/10/2024) for Anxiety, Insomnia.       Rockie Agent, MD  [facility] [phone] (phone) [phone] (fax)  [facility]

## 2024-02-11 NOTE — Patient Instructions (Signed)
 To keep you healthy, please keep in mind the following health maintenance items that you are due for:   Health Maintenance Due  Topic Date Due   Cervical Cancer Screening (Pap smear)  Never done     Best Wishes,   Dr. Lang

## 2024-03-16 ENCOUNTER — Telehealth: Payer: Self-pay | Admitting: Family Medicine

## 2024-03-16 NOTE — Telephone Encounter (Signed)
 Pt dropped off staff health assessment/medical report form to be completed.  Call pt when ready for pick up.  Placed in Dr. Feliberto box

## 2024-03-18 NOTE — Telephone Encounter (Signed)
 Called patient and she stated that she will be picking up the forms on Wednesday December 17.

## 2024-03-18 NOTE — Telephone Encounter (Signed)
 Per the encounter, we are to call her when it is ready for pick up.  I will go ahead and call her to let her know she can pick it up.

## 2024-03-18 NOTE — Telephone Encounter (Signed)
 PT Telephone encounter reviewed.

## 2024-03-18 NOTE — Telephone Encounter (Signed)
 Form completed and placed at [facility] work station. Does it need to be faxed or is pt picking this up?
